# Patient Record
Sex: Female | Born: 1970
Health system: Southern US, Community
[De-identification: ages and names within clinical notes are randomized; demographics above are authoritative.]

## PROBLEM LIST (undated history)

## (undated) DIAGNOSIS — D649 Anemia, unspecified: Secondary | ICD-10-CM

## (undated) DIAGNOSIS — K219 Gastro-esophageal reflux disease without esophagitis: Secondary | ICD-10-CM

## (undated) DIAGNOSIS — J45909 Unspecified asthma, uncomplicated: Secondary | ICD-10-CM

## (undated) DIAGNOSIS — D75839 Thrombocytosis, unspecified: Secondary | ICD-10-CM

## (undated) DIAGNOSIS — I10 Essential (primary) hypertension: Secondary | ICD-10-CM

## (undated) HISTORY — DX: Unspecified asthma, uncomplicated: J45.909

## (undated) HISTORY — PX: NO PAST SURGERIES: SHX2092

## (undated) HISTORY — DX: Anemia, unspecified: D64.9

## (undated) HISTORY — DX: Gastro-esophageal reflux disease without esophagitis: K21.9

---

## 2004-05-31 ENCOUNTER — Emergency Department: Payer: Self-pay | Admitting: Emergency Medicine

## 2004-06-18 ENCOUNTER — Emergency Department: Payer: Self-pay | Admitting: Emergency Medicine

## 2012-09-18 LAB — HM MAMMOGRAPHY: HM MAMMO: NORMAL (ref 0–4)

## 2013-10-08 LAB — HM PAP SMEAR: HM Pap smear: NEGATIVE

## 2014-10-11 LAB — LIPID PANEL
HDL: 97 mg/dL — AB (ref 35–70)
LDL Cholesterol: 77 mg/dL
TRIGLYCERIDES: 103 mg/dL (ref 40–160)

## 2014-10-11 LAB — HEPATIC FUNCTION PANEL
ALT: 13 U/L (ref 7–35)
AST: 18 U/L (ref 13–35)
Alkaline Phosphatase: 38 U/L (ref 25–125)

## 2014-10-11 LAB — BASIC METABOLIC PANEL
CREATININE: 1 mg/dL (ref <=1.1)
GLUCOSE: 84 mg/dL

## 2015-09-25 ENCOUNTER — Encounter: Payer: Self-pay | Admitting: Family Medicine

## 2015-09-25 ENCOUNTER — Ambulatory Visit (INDEPENDENT_AMBULATORY_CARE_PROVIDER_SITE_OTHER): Payer: BLUE CROSS/BLUE SHIELD | Admitting: Family Medicine

## 2015-09-25 VITALS — BP 130/88 | HR 82 | Temp 98.7°F | Resp 16 | Ht 63.5 in | Wt 164.0 lb

## 2015-09-25 DIAGNOSIS — Z01419 Encounter for gynecological examination (general) (routine) without abnormal findings: Secondary | ICD-10-CM | POA: Diagnosis not present

## 2015-09-25 DIAGNOSIS — R6889 Other general symptoms and signs: Secondary | ICD-10-CM | POA: Diagnosis not present

## 2015-09-25 DIAGNOSIS — Z3041 Encounter for surveillance of contraceptive pills: Secondary | ICD-10-CM | POA: Diagnosis not present

## 2015-09-25 MED ORDER — ALYACEN 1/35 1-35 MG-MCG PO TABS: 1.0000 | ORAL_TABLET | Freq: Every day | ORAL | Status: DC

## 2015-09-25 NOTE — Progress Notes (Signed)
Subjective:    Patient ID: Madison Conway, female    DOB: [birth year], 45 y.o.   MRN: [id number]  HPI: Madison Conway is a 45 y.o. female presenting on 09/25/2015 for Annual Exam   HPI  Pt presents for annual exam.  No breast changes. Ocasional BSE. No vaginal bleeding. No discharge. No pain with sex. LMP 1 week ago. Periods are regular. No cramps or clots. However pt is worried about her iron levels- feeling cold and fatigued all the time. Mammogram done 01/2014 at her office- normal. No family hx of breast cancer. Wants to screen in her 78's.  Exercise regularly. 1 sexual partner- monogamous. Current taking OCP's.   Past Medical History  Diagnosis Date  . Asthma    Social History   Social History  . Marital Status: Single    Spouse Name: N/A  . Number of Children: N/A  . Years of Education: N/A   Occupational History  . Not on file.   Social History Main Topics  . Smoking status: Never Smoker   . Smokeless tobacco: Not on file  . Alcohol Use: Yes  . Drug Use: No  . Sexual Activity: Not on file   Other Topics Concern  . Not on file   Social History Narrative  . No narrative on file   Family History  Problem Relation Age of Onset  . Diabetes Maternal Grandmother    No current outpatient prescriptions on file prior to visit.   No current facility-administered medications on file prior to visit.    Review of Systems  Constitutional: Positive for fatigue. Negative for fever and chills.  HENT: Negative.   Respiratory: Negative for cough, chest tightness and wheezing.   Cardiovascular: Negative for chest pain and leg swelling.  Gastrointestinal: Negative for nausea, vomiting, abdominal pain, diarrhea and constipation.  Endocrine: Positive for cold intolerance. Negative for heat intolerance, polydipsia, polyphagia and polyuria.  Genitourinary: Negative for dysuria, vaginal bleeding, vaginal discharge, difficulty urinating, vaginal pain and pelvic pain.    Musculoskeletal: Negative.   Neurological: Negative for dizziness, light-headedness and numbness.  Psychiatric/Behavioral: Negative.    Per HPI unless specifically indicated above     Objective:    BP 130/88 mmHg  Pulse 82  Temp(Src) 98.7 F (37.1 C) (Oral)  Resp 16  Ht 5' 3.5" (1.613 m)  Wt 164 lb (74.39 kg)  BMI 28.59 kg/m2  LMP 09/19/2015  Wt Readings from Last 3 Encounters:  09/25/15 164 lb (74.39 kg)    Depression screen PHQ 2/9 09/25/2015  Decreased Interest 0  Down, Depressed, Hopeless 0  PHQ - 2 Score 0     Physical Exam  Constitutional: She is oriented to person, place, and time. She appears well-developed and well-nourished.  HENT:  Head: Normocephalic and atraumatic.  Neck: Normal range of motion. Neck supple. No thyromegaly present.  Cardiovascular: Normal rate and regular rhythm.  Exam reveals no gallop and no friction rub.   No murmur heard. Pulmonary/Chest: Breath sounds normal. No respiratory distress. She has no wheezes. Right breast exhibits no inverted nipple, no mass, no nipple discharge, no skin change and no tenderness. Left breast exhibits no inverted nipple, no mass, no nipple discharge, no skin change and no tenderness.  Abdominal: Soft. Bowel sounds are normal. She exhibits no distension. There is no tenderness.  Genitourinary: Vagina normal and uterus normal. No breast swelling, tenderness or discharge. There is no rash or tenderness on the right labia. There is no rash, tenderness or lesion  on the left labia. Uterus is not tender. Cervix exhibits no motion tenderness. Right adnexum displays no mass, no tenderness and no fullness. Left adnexum displays no mass, no tenderness and no fullness. No tenderness in the vagina.  Musculoskeletal: Normal range of motion. She exhibits no edema or tenderness.  Lymphadenopathy:    She has no cervical adenopathy.  Neurological: She is alert and oriented to person, place, and time.  Skin: Skin is warm and dry.   Psychiatric: She has a normal mood and affect. Her behavior is normal. Judgment normal.   Results for orders placed or performed in visit on 09/25/15  HM MAMMOGRAPHY  Result Value Ref Range   HM Mammogram Self Reported Normal 0-4 Bi-Rad, Self Reported Normal  HM PAP SMEAR  Result Value Ref Range   HM Pap smear negative       Assessment & Plan:   Problem List Items Addressed This Visit    None    Visit Diagnoses    Well woman exam with routine gynecological exam    -  Primary    Pap due 2018. Health maintenance recommendations reviewed. Encouraged healthy lifestyle. Baseline Mammo WNL. Pt will screen every 2 years.     Relevant Orders    TSH    CBC with Differential/Platelet    Basic Metabolic Panel (BMET)    Cold intolerance        Check TSH and CBC.     Relevant Orders    TSH    CBC with Differential/Platelet    Oral contraceptive pill surveillance        MEC criteria 2- discussed risks vs benefits for OCPs in >40. Recommend LARC. Information given to patient. She will research and self refer to GYN.        Meds ordered this encounter  Medications  . DISCONTD: ALAYCEN 1/35 tablet    Sig: Take 1 tablet by mouth daily.    Refill:  11  . ALAYCEN 1/35 tablet    Sig: Take 1 tablet by mouth daily.    Dispense:  1 Package    Refill:  11    Order Specific Question:  Supervising Provider    Answer:  Arlis Porta [phone]      Follow up plan: Return in about 1 year (around 09/24/2016).

## 2015-09-25 NOTE — Patient Instructions (Addendum)
[facility]:  [facility] [address] [place], [place] [address] PHONE [phone] FAX [phone]  Levonorgestrel intrauterine device (IUD) What is this medicine? LEVONORGESTREL IUD (LEE voe nor jes trel) is a contraceptive (birth control) device. The device is placed inside the uterus by a healthcare professional. It is used to prevent pregnancy and can also be used to treat heavy bleeding that occurs during your period. Depending on the device, it can be used for 3 to 5 years. This medicine may be used for other purposes; ask your health care provider or pharmacist if you have questions. What should I tell my health care provider before I take this medicine? They need to know if you have any of these conditions: -abnormal Pap smear -cancer of the breast, uterus, or cervix -diabetes -endometritis -genital or pelvic infection now or in the past -have more than one sexual partner or your partner has more than one partner -heart disease -history of an ectopic or tubal pregnancy -immune system problems -IUD in place -liver disease or tumor -problems with blood clots or take blood-thinners -use intravenous drugs -uterus of unusual shape -vaginal bleeding that has not been explained -an unusual or allergic reaction to levonorgestrel, other hormones, silicone, or polyethylene, medicines, foods, dyes, or preservatives -pregnant or trying to get pregnant -breast-feeding How should I use this medicine? This device is placed inside the uterus by a health care professional. Talk to your pediatrician regarding the use of this medicine in children. Special care may be needed. Overdosage: If you think you have taken too much of this medicine contact a poison control center or emergency room at once. NOTE: This medicine is only for you. Do not share this medicine with others. What if I miss a dose? This does not apply. What may interact with this  medicine? Do not take this medicine with any of the following medications: -amprenavir -bosentan -fosamprenavir This medicine may also interact with the following medications: -aprepitant -barbiturate medicines for inducing sleep or treating seizures -bexarotene -griseofulvin -medicines to treat seizures like carbamazepine, ethotoin, felbamate, oxcarbazepine, phenytoin, topiramate -modafinil -pioglitazone -rifabutin -rifampin -rifapentine -some medicines to treat HIV infection like atazanavir, indinavir, lopinavir, nelfinavir, tipranavir, ritonavir -St. John's wort -warfarin This list may not describe all possible interactions. Give your health care provider a list of all the medicines, herbs, non-prescription drugs, or dietary supplements you use. Also tell them if you smoke, drink alcohol, or use illegal drugs. Some items may interact with your medicine. What should I watch for while using this medicine? Visit your doctor or health care professional for regular check ups. See your doctor if you or your partner has sexual contact with others, becomes HIV positive, or gets a sexual transmitted disease. This product does not protect you against HIV infection (AIDS) or other sexually transmitted diseases. You can check the placement of the IUD yourself by reaching up to the top of your vagina with clean fingers to feel the threads. Do not pull on the threads. It is a good habit to check placement after each menstrual period. Call your doctor right away if you feel more of the IUD than just the threads or if you cannot feel the threads at all. The IUD may come out by itself. You may become pregnant if the device comes out. If you notice that the IUD has come out use a backup birth control method like condoms and call your health care provider. Using tampons will not change  the position of the IUD and are okay to use during your period. What side effects may I notice from receiving this  medicine? Side effects that you should report to your doctor or health care professional as soon as possible: -allergic reactions like skin rash, itching or hives, swelling of the face, lips, or tongue -fever, flu-like symptoms -genital sores -high blood pressure -no menstrual period for 6 weeks during use -pain, swelling, warmth in the leg -pelvic pain or tenderness -severe or sudden headache -signs of pregnancy -stomach cramping -sudden shortness of breath -trouble with balance, talking, or walking -unusual vaginal bleeding, discharge -yellowing of the eyes or skin Side effects that usually do not require medical attention (report to your doctor or health care professional if they continue or are bothersome): -acne -breast pain -change in sex drive or performance -changes in weight -cramping, dizziness, or faintness while the device is being inserted -headache -irregular menstrual bleeding within first 3 to 6 months of use -nausea This list may not describe all possible side effects. Call your doctor for medical advice about side effects. You may report side effects to FDA at 1-800-FDA-1088. Where should I keep my medicine? This does not apply. NOTE: This sheet is a summary. It may not cover all possible information. If you have questions about this medicine, talk to your doctor, pharmacist, or health care provider.    2016, Elsevier/Gold Standard. (2011-07-11 13:54:04)

## 2015-09-26 DIAGNOSIS — Z01419 Encounter for gynecological examination (general) (routine) without abnormal findings: Secondary | ICD-10-CM | POA: Diagnosis not present

## 2015-09-26 DIAGNOSIS — R6889 Other general symptoms and signs: Secondary | ICD-10-CM | POA: Diagnosis not present

## 2015-09-27 LAB — BASIC METABOLIC PANEL
BUN/Creatinine Ratio: 9 (ref 9–23)
BUN: 8 mg/dL (ref 6–24)
CALCIUM: 9.2 mg/dL (ref 8.7–10.2)
CO2: 24 mmol/L (ref 18–29)
Chloride: 100 mmol/L (ref 96–106)
Creatinine, Ser: 0.87 mg/dL (ref 0.57–1.00)
GFR calc Af Amer: 94 mL/min/{1.73_m2} (ref >59)
GFR, EST NON AFRICAN AMERICAN: 82 mL/min/{1.73_m2} (ref >59)
Glucose: 91 mg/dL (ref 65–99)
POTASSIUM: 4.3 mmol/L (ref 3.5–5.2)
SODIUM: 138 mmol/L (ref 134–144)

## 2015-09-27 LAB — CBC WITH DIFFERENTIAL/PLATELET
BASOS: 1 %
Basophils Absolute: 0.1 10*3/uL (ref 0.0–0.2)
EOS (ABSOLUTE): 0.3 10*3/uL (ref 0.0–0.4)
EOS: 4 %
HEMATOCRIT: 33 % — AB (ref 34.0–46.6)
Hemoglobin: 10.7 g/dL — ABNORMAL LOW (ref 11.1–15.9)
Immature Grans (Abs): 0 10*3/uL (ref 0.0–0.1)
Immature Granulocytes: 0 %
LYMPHS ABS: 2.7 10*3/uL (ref 0.7–3.1)
Lymphs: 38 %
MCH: 25.8 pg — ABNORMAL LOW (ref 26.6–33.0)
MCHC: 32.4 g/dL (ref 31.5–35.7)
MCV: 80 fL (ref 79–97)
MONOS ABS: 0.6 10*3/uL (ref 0.1–0.9)
Monocytes: 8 %
NEUTROS ABS: 3.5 10*3/uL (ref 1.4–7.0)
Neutrophils: 49 %
Platelets: 418 10*3/uL — ABNORMAL HIGH (ref 150–379)
RBC: 4.15 x10E6/uL (ref 3.77–5.28)
RDW: 16 % — AB (ref 12.3–15.4)
WBC: 7.1 10*3/uL (ref 3.4–10.8)

## 2015-09-27 LAB — TSH: TSH: 1.17 u[IU]/mL (ref 0.450–4.500)

## 2015-12-01 ENCOUNTER — Telehealth: Payer: Self-pay | Admitting: Family Medicine

## 2015-12-01 DIAGNOSIS — Z114 Encounter for screening for human immunodeficiency virus [HIV]: Secondary | ICD-10-CM

## 2015-12-01 DIAGNOSIS — D509 Iron deficiency anemia, unspecified: Secondary | ICD-10-CM

## 2015-12-01 NOTE — Telephone Encounter (Signed)
FYI I have placed the orders as future. They just need to be released when she comes for labs. Thanks! AK

## 2015-12-01 NOTE — Telephone Encounter (Signed)
Pt have sent message My Chart requesting HIV added to order

## 2015-12-02 ENCOUNTER — Encounter: Payer: Self-pay | Admitting: Family Medicine

## 2015-12-04 ENCOUNTER — Other Ambulatory Visit: Payer: Self-pay | Admitting: *Deleted

## 2015-12-04 DIAGNOSIS — Z114 Encounter for screening for human immunodeficiency virus [HIV]: Secondary | ICD-10-CM | POA: Diagnosis not present

## 2015-12-04 DIAGNOSIS — D509 Iron deficiency anemia, unspecified: Secondary | ICD-10-CM | POA: Diagnosis not present

## 2015-12-04 DIAGNOSIS — Z1159 Encounter for screening for other viral diseases: Secondary | ICD-10-CM | POA: Diagnosis not present

## 2015-12-05 ENCOUNTER — Other Ambulatory Visit: Payer: BLUE CROSS/BLUE SHIELD

## 2015-12-05 LAB — CBC WITH DIFFERENTIAL/PLATELET
BASOS ABS: 61 {cells}/uL (ref 0–200)
Basophils Relative: 1 %
Eosinophils Absolute: 244 cells/uL (ref 15–500)
Eosinophils Relative: 4 %
HEMATOCRIT: 34.6 % — AB (ref 35.0–45.0)
HEMOGLOBIN: 10.9 g/dL — AB (ref 11.7–15.5)
LYMPHS ABS: 1830 {cells}/uL (ref 850–3900)
Lymphocytes Relative: 30 %
MCH: 25.5 pg — AB (ref 27.0–33.0)
MCHC: 31.5 g/dL — AB (ref 32.0–36.0)
MCV: 81 fL (ref 80.0–100.0)
MONO ABS: 488 {cells}/uL (ref 200–950)
MPV: 8.3 fL (ref 7.5–12.5)
Monocytes Relative: 8 %
NEUTROS PCT: 57 %
Neutro Abs: 3477 cells/uL (ref 1500–7800)
Platelets: 445 10*3/uL — ABNORMAL HIGH (ref 140–400)
RBC: 4.27 MIL/uL (ref 3.80–5.10)
RDW: 16.7 % — ABNORMAL HIGH (ref 11.0–15.0)
WBC: 6.1 10*3/uL (ref 3.8–10.8)

## 2015-12-05 LAB — IRON AND TIBC
%SAT: 20 % (ref 11–50)
IRON: 82 ug/dL (ref 40–190)
TIBC: 405 ug/dL (ref 250–450)
UIBC: 323 ug/dL (ref 125–400)

## 2015-12-05 LAB — FERRITIN: Ferritin: 17 ng/mL (ref 10–232)

## 2015-12-06 LAB — HIV ANTIBODY (ROUTINE TESTING W REFLEX): HIV: NONREACTIVE

## 2015-12-06 LAB — RETICULOCYTES
ABS RETIC: 42100 {cells}/uL (ref 20000–80000)
RBC.: 4.21 MIL/uL (ref 3.80–5.10)
RETIC CT PCT: 1 %

## 2015-12-07 ENCOUNTER — Encounter: Payer: Self-pay | Admitting: Family Medicine

## 2015-12-07 ENCOUNTER — Ambulatory Visit (INDEPENDENT_AMBULATORY_CARE_PROVIDER_SITE_OTHER): Payer: BLUE CROSS/BLUE SHIELD | Admitting: Family Medicine

## 2015-12-07 VITALS — BP 138/86 | HR 77 | Temp 98.6°F | Resp 16 | Ht 63.5 in | Wt 165.6 lb

## 2015-12-07 DIAGNOSIS — D509 Iron deficiency anemia, unspecified: Secondary | ICD-10-CM

## 2015-12-07 DIAGNOSIS — Z23 Encounter for immunization: Secondary | ICD-10-CM | POA: Diagnosis not present

## 2015-12-07 NOTE — Assessment & Plan Note (Signed)
Iron studies are WNL limits. Still mild anemia. Pt feeling better with iron supplementation. Will plan on rechecking labs in 3 mos. If still anemic consider hematology for work-up of anemia possible thalassemia.

## 2015-12-07 NOTE — Progress Notes (Signed)
Subjective:    Patient ID: Madison Conway, female    DOB: 1971/06/05, 45 y.o.   MRN: [id number]  HPI: Madison Conway is a 45 y.o. female presenting on 12/07/2015 for lab result   HPI  Patient presents today for follow up for anemia. Patient has been taking iron since last visit and symptoms have completely resolved, in fact has had more energy and feels like herself. Denies SOB, dizziness, "feeling cold," lack of energy. Reports she is also adding spinach to her diet. Denies heavy bleeding, clots or any other bleeding issues.  Anemia has been a chronic issues in the past.  Reports no issues with taking iron, denies constipation, regular BMs that are soft.  Past Medical History  Diagnosis Date  . Asthma     Current Outpatient Prescriptions on File Prior to Visit  Medication Sig  . ALAYCEN 1/35 tablet Take 1 tablet by mouth daily.   No current facility-administered medications on file prior to visit.    Review of Systems  Constitutional: Negative for fever, chills, activity change, appetite change, fatigue and unexpected weight change.  HENT: Negative.   Eyes: Negative for pain and redness.  Respiratory: Negative for cough, chest tightness and wheezing.   Cardiovascular: Negative for chest pain and leg swelling.  Gastrointestinal: Negative for nausea, vomiting, abdominal pain, diarrhea and constipation.  Endocrine: Negative.  Negative for cold intolerance, heat intolerance, polydipsia, polyphagia and polyuria.  Genitourinary: Negative for dysuria and difficulty urinating.  Musculoskeletal: Negative for myalgias, back pain, arthralgias, neck pain and neck stiffness.  Skin: Negative for color change.  Neurological: Negative for dizziness, light-headedness and numbness.  Hematological: Negative for adenopathy. Does not bruise/bleed easily.  Psychiatric/Behavioral: Negative.    Per HPI unless specifically indicated above     Objective:    BP 138/86 mmHg  Pulse 77   Temp(Src) 98.6 F (37 C) (Oral)  Resp 16  Ht 5' 3.5" (1.613 m)  Wt 165 lb 9.6 oz (75.116 kg)  BMI 28.87 kg/m2  LMP 11/19/2015  Wt Readings from Last 3 Encounters:  12/07/15 165 lb 9.6 oz (75.116 kg)  09/25/15 164 lb (74.39 kg)    Physical Exam  Constitutional: She is oriented to person, place, and time. She appears well-developed and well-nourished.  HENT:  Head: Normocephalic and atraumatic.  Neck: Neck supple.  Cardiovascular: Normal rate, regular rhythm and normal heart sounds.  Exam reveals no gallop and no friction rub.   No murmur heard. Pulmonary/Chest: Effort normal and breath sounds normal. She has no wheezes. She exhibits no tenderness.  Abdominal: Soft. Normal appearance and bowel sounds are normal. She exhibits no distension and no mass. There is no tenderness. There is no rebound and no guarding.  Musculoskeletal: Normal range of motion. She exhibits no edema or tenderness.  Lymphadenopathy:    She has no cervical adenopathy.  Neurological: She is alert and oriented to person, place, and time.  Skin: Skin is warm and dry.  Nursing note and vitals reviewed.  Results for orders placed or performed in visit on 12/04/15  CBC with Differential/Platelet  Result Value Ref Range   WBC 6.1 3.8 - 10.8 K/uL   RBC 4.27 3.80 - 5.10 MIL/uL   Hemoglobin 10.9 (L) 11.7 - 15.5 g/dL   HCT 34.6 (L) 35.0 - 45.0 %   MCV 81.0 80.0 - 100.0 fL   MCH 25.5 (L) 27.0 - 33.0 pg   MCHC 31.5 (L) 32.0 - 36.0 g/dL   RDW 16.7 (H) 11.0 -  15.0 %   Platelets 445 (H) 140 - 400 K/uL   MPV 8.3 7.5 - 12.5 fL   Neutro Abs 3477 1500 - 7800 cells/uL   Lymphs Abs 1830 850 - 3900 cells/uL   Monocytes Absolute 488 200 - 950 cells/uL   Eosinophils Absolute 244 15 - 500 cells/uL   Basophils Absolute 61 0 - 200 cells/uL   Neutrophils Relative % 57 %   Lymphocytes Relative 30 %   Monocytes Relative 8 %   Eosinophils Relative 4 %   Basophils Relative 1 %   Smear Review Criteria for review not met     Ferritin  Result Value Ref Range   Ferritin 17 10 - 232 ng/mL  Iron Binding Cap (TIBC)  Result Value Ref Range   Iron 82 40 - 190 ug/dL   UIBC 323 125 - 400 ug/dL   TIBC 405 250 - 450 ug/dL   %SAT 20 11 - 50 %  Retic  Result Value Ref Range   Retic Ct Pct 1.0 %   RBC. 4.21 3.80 - 5.10 MIL/uL   ABS Retic 42100 20000 - 80000 cells/uL  HIV antibody (with reflex)  Result Value Ref Range   HIV 1&2 Ab, 4th Generation NONREACTIVE NONREACTIVE      Assessment & Plan:   Problem List Items Addressed This Visit      Other   Iron deficiency anemia - Primary    Iron studies are WNL limits. Still mild anemia. Pt feeling better with iron supplementation. Will plan on rechecking labs in 3 mos. If still anemic consider hematology for work-up of anemia possible thalassemia.        Other Visit Diagnoses    Need for Tdap vaccination        Relevant Orders    Tdap vaccine greater than or equal to 7yo IM (Completed)       No orders of the defined types were placed in this encounter.      Follow up plan: Return in about 3 months (around 03/08/2016) for labs- anemia recheck. Marland Kitchen

## 2015-12-07 NOTE — Patient Instructions (Signed)
Iron Deficiency Anemia, Adult Anemia is a condition in which there are less red blood cells or hemoglobin in the blood than normal. Hemoglobin is the part of red blood cells that carries oxygen. Iron deficiency anemia is anemia caused by too little iron. It is the most common type of anemia. It may leave you tired and short of breath. CAUSES   Lack of iron in the diet.  Poor absorption of iron, as seen with intestinal disorders.  Intestinal bleeding.  Heavy periods. SIGNS AND SYMPTOMS  Mild anemia may not be noticeable. Symptoms may include:  Fatigue.  Headache.  Pale skin.  Weakness.  Tiredness.  Shortness of breath.  Dizziness.  Cold hands and feet.  Fast or irregular heartbeat. DIAGNOSIS  Diagnosis requires a thorough evaluation and physical exam by your health care provider. Blood tests are generally used to confirm iron deficiency anemia. Additional tests may be done to find the underlying cause of your anemia. These may include:  Testing for blood in the stool (fecal occult blood test).  A procedure to see inside the colon and rectum (colonoscopy).  A procedure to see inside the esophagus and stomach (endoscopy). TREATMENT  Iron deficiency anemia is treated by correcting the cause of the deficiency. Treatment may involve:  Adding iron-rich foods to your diet.  Taking iron supplements. Pregnant or breastfeeding women need to take extra iron because their normal diet usually does not provide the required amount.  Taking vitamins. Vitamin C improves the absorption of iron. Your health care provider may recommend that you take your iron tablets with a glass of orange juice or vitamin C supplement.  Medicines to make heavy menstrual flow lighter.  Surgery. HOME CARE INSTRUCTIONS   Take iron as directed by your health care provider.  If you cannot tolerate taking iron supplements by mouth, talk to your health care provider about taking them through a vein  (intravenously) or an injection into a muscle.  For the best iron absorption, iron supplements should be taken on an empty stomach. If you cannot tolerate them on an empty stomach, you may need to take them with food.  Do not drink milk or take antacids at the same time as your iron supplements. Milk and antacids may interfere with the absorption of iron.  Iron supplements can cause constipation. Make sure to include fiber in your diet to prevent constipation. A stool softener may also be recommended.  Take vitamins as directed by your health care provider.  Eat a diet rich in iron. Foods high in iron include liver, lean beef, whole-grain bread, eggs, dried fruit, and dark green leafy vegetables. SEEK IMMEDIATE MEDICAL CARE IF:   You faint. If this happens, do not drive. Call your local emergency services (911 in [place] if no other help is available.  You have chest pain.  You feel nauseous or vomit.  You have severe or increased shortness of breath with activity.  You feel weak.  You have a rapid heartbeat.  You have unexplained sweating.  You become light-headed when getting up from a chair or bed. MAKE SURE YOU:   Understand these instructions.  Will watch your condition.  Will get help right away if you are not doing well or get worse.   This information is not intended to replace advice given to you by your health care provider. Make sure you discuss any questions you have with your health care provider.   Document Released: 06/07/2000 Document Revised: 07/01/2014 Document Reviewed: 02/15/2013 Elsevier   Interactive Patient Education 2016 Elsevier Inc.  

## 2016-02-28 ENCOUNTER — Encounter: Payer: Self-pay | Admitting: Family Medicine

## 2016-02-28 LAB — LIPID PANEL
CHOLESTEROL: 195
HDL: 101
LDL: 76
TRIGLYCERIDES: 86

## 2016-03-07 ENCOUNTER — Other Ambulatory Visit: Payer: BLUE CROSS/BLUE SHIELD

## 2016-03-07 ENCOUNTER — Telehealth: Payer: Self-pay | Admitting: Family Medicine

## 2016-03-07 ENCOUNTER — Other Ambulatory Visit: Payer: Self-pay | Admitting: Family Medicine

## 2016-03-07 DIAGNOSIS — D509 Iron deficiency anemia, unspecified: Secondary | ICD-10-CM

## 2016-03-07 LAB — FERRITIN: Ferritin: 28 ng/mL (ref 10–232)

## 2016-03-07 NOTE — Telephone Encounter (Signed)
Pt needs lab order has appointment today at 4:00 pm

## 2016-03-07 NOTE — Addendum Note (Signed)
Addended byVincenza Hews, Ayelet Gruenewald L on: 03/07/2016 12:35 PM   Modules accepted: Orders

## 2016-03-08 LAB — CBC WITH DIFFERENTIAL/PLATELET
BASOS ABS: 0 {cells}/uL (ref 0–200)
Basophils Relative: 0 %
EOS ABS: 355 {cells}/uL (ref 15–500)
Eosinophils Relative: 5 %
HCT: 34.7 % — ABNORMAL LOW (ref 35.0–45.0)
Hemoglobin: 11 g/dL — ABNORMAL LOW (ref 11.7–15.5)
LYMPHS PCT: 32 %
Lymphs Abs: 2272 cells/uL (ref 850–3900)
MCH: 26.8 pg — AB (ref 27.0–33.0)
MCHC: 31.7 g/dL — AB (ref 32.0–36.0)
MCV: 84.4 fL (ref 80.0–100.0)
MONOS PCT: 9 %
MPV: 8.2 fL (ref 7.5–12.5)
Monocytes Absolute: 639 cells/uL (ref 200–950)
Neutro Abs: 3834 cells/uL (ref 1500–7800)
Neutrophils Relative %: 54 %
PLATELETS: 406 10*3/uL — AB (ref 140–400)
RBC: 4.11 MIL/uL (ref 3.80–5.10)
RDW: 15.2 % — AB (ref 11.0–15.0)
WBC: 7.1 10*3/uL (ref 3.8–10.8)

## 2016-03-08 LAB — IRON AND TIBC
%SAT: 3 % — AB (ref 11–50)
Iron: 13 ug/dL — ABNORMAL LOW (ref 40–190)
TIBC: 385 ug/dL (ref 250–450)
UIBC: 372 ug/dL (ref 125–400)

## 2016-03-08 LAB — RETICULOCYTES
ABS Retic: 36990 cells/uL (ref 20000–80000)
RBC.: 4.11 MIL/uL (ref 3.80–5.10)
Retic Ct Pct: 0.9 %

## 2016-07-09 ENCOUNTER — Ambulatory Visit (INDEPENDENT_AMBULATORY_CARE_PROVIDER_SITE_OTHER): Payer: BLUE CROSS/BLUE SHIELD | Admitting: Family Medicine

## 2016-07-09 ENCOUNTER — Ambulatory Visit
Admission: RE | Admit: 2016-07-09 | Discharge: 2016-07-09 | Disposition: A | Discharge status: Home / Self Care | Payer: BLUE CROSS/BLUE SHIELD | Source: Ambulatory Visit | Attending: Family Medicine | Admitting: Family Medicine

## 2016-07-09 ENCOUNTER — Observation Stay
Admission: EM | Admit: 2016-07-09 | Discharge: 2016-07-10 | Disposition: A | Discharge status: Home / Self Care | Payer: BLUE CROSS/BLUE SHIELD | Attending: Internal Medicine | Admitting: Internal Medicine

## 2016-07-09 ENCOUNTER — Encounter: Payer: Self-pay | Admitting: Emergency Medicine

## 2016-07-09 ENCOUNTER — Encounter: Payer: Self-pay | Admitting: Family Medicine

## 2016-07-09 VITALS — BP 138/88 | HR 92 | Temp 98.7°F | Resp 16 | Ht 63.5 in | Wt 167.6 lb

## 2016-07-09 DIAGNOSIS — R109 Unspecified abdominal pain: Secondary | ICD-10-CM

## 2016-07-09 DIAGNOSIS — R110 Nausea: Secondary | ICD-10-CM | POA: Diagnosis not present

## 2016-07-09 DIAGNOSIS — R1013 Epigastric pain: Secondary | ICD-10-CM | POA: Diagnosis not present

## 2016-07-09 DIAGNOSIS — R1011 Right upper quadrant pain: Secondary | ICD-10-CM | POA: Diagnosis not present

## 2016-07-09 DIAGNOSIS — Z793 Long term (current) use of hormonal contraceptives: Secondary | ICD-10-CM | POA: Diagnosis not present

## 2016-07-09 DIAGNOSIS — K8050 Calculus of bile duct without cholangitis or cholecystitis without obstruction: Secondary | ICD-10-CM

## 2016-07-09 DIAGNOSIS — K819 Cholecystitis, unspecified: Secondary | ICD-10-CM | POA: Diagnosis not present

## 2016-07-09 LAB — COMPREHENSIVE METABOLIC PANEL
ALBUMIN: 4 g/dL (ref 3.5–5.0)
ALK PHOS: 63 U/L (ref 38–126)
ALT: 59 U/L — ABNORMAL HIGH (ref 14–54)
ANION GAP: 7 (ref 5–15)
AST: 38 U/L (ref 15–41)
BILIRUBIN TOTAL: 1.3 mg/dL — AB (ref 0.3–1.2)
BUN: 6 mg/dL (ref 6–20)
CALCIUM: 9 mg/dL (ref 8.9–10.3)
CO2: 25 mmol/L (ref 22–32)
Chloride: 103 mmol/L (ref 101–111)
Creatinine, Ser: 0.96 mg/dL (ref 0.44–1.00)
GFR calc Af Amer: >60 mL/min (ref >60)
GLUCOSE: 93 mg/dL (ref 65–99)
Potassium: 3.9 mmol/L (ref 3.5–5.1)
Sodium: 135 mmol/L (ref 135–145)
Total Protein: 8.1 g/dL (ref 6.5–8.1)

## 2016-07-09 LAB — CBC
HCT: 35.2 % (ref 35.0–47.0)
Hemoglobin: 11.7 g/dL — ABNORMAL LOW (ref 12.0–16.0)
MCH: 27.3 pg (ref 26.0–34.0)
MCHC: 33.2 g/dL (ref 32.0–36.0)
MCV: 82.4 fL (ref 80.0–100.0)
Platelets: 433 10*3/uL (ref 150–440)
RBC: 4.27 MIL/uL (ref 3.80–5.20)
RDW: 15.3 % — AB (ref 11.5–14.5)
WBC: 9.1 10*3/uL (ref 3.6–11.0)

## 2016-07-09 LAB — URINALYSIS, COMPLETE (UACMP) WITH MICROSCOPIC
Bacteria, UA: NONE SEEN
Bilirubin Urine: NEGATIVE
GLUCOSE, UA: NEGATIVE mg/dL
HGB URINE DIPSTICK: NEGATIVE
Ketones, ur: 5 mg/dL — AB
NITRITE: NEGATIVE
PH: 6 (ref 5.0–8.0)
Protein, ur: NEGATIVE mg/dL
SPECIFIC GRAVITY, URINE: 1.006 (ref 1.005–1.030)
WBC, UA: NONE SEEN WBC/hpf (ref 0–5)

## 2016-07-09 LAB — LIPASE, BLOOD: Lipase: 25 U/L (ref 11–51)

## 2016-07-09 MED ORDER — ENOXAPARIN SODIUM 40 MG/0.4ML ~~LOC~~ SOLN
40.0000 mg | SUBCUTANEOUS | Status: DC
  Filled 2016-07-09 (×2): qty 0.4

## 2016-07-09 MED ORDER — GI COCKTAIL ~~LOC~~
30.0000 mL | Freq: Once | ORAL | Status: AC
  Administered 2016-07-09: 30 mL via ORAL
  Filled 2016-07-09: qty 30

## 2016-07-09 MED ORDER — SODIUM CHLORIDE 0.9 % IV SOLN
3.0000 g | Freq: Four times a day (QID) | INTRAVENOUS | Status: DC
  Administered 2016-07-10 (×2): 3 g via INTRAVENOUS
  Filled 2016-07-09 (×7): qty 3

## 2016-07-09 MED ORDER — MORPHINE SULFATE (PF) 2 MG/ML IV SOLN: 1.0000 mg | INTRAVENOUS | Status: DC | PRN

## 2016-07-09 MED ORDER — HYDROCODONE-ACETAMINOPHEN 5-325 MG PO TABS
1.0000 | ORAL_TABLET | ORAL | Status: DC | PRN
  Administered 2016-07-10: 1 via ORAL
  Filled 2016-07-09: qty 1

## 2016-07-09 MED ORDER — ONDANSETRON HCL 4 MG/2ML IJ SOLN: 4.0000 mg | Freq: Four times a day (QID) | INTRAMUSCULAR | Status: DC | PRN

## 2016-07-09 MED ORDER — ACETAMINOPHEN 650 MG RE SUPP
650.0000 mg | Freq: Four times a day (QID) | RECTAL | Status: DC | PRN
  Filled 2016-07-09: qty 1

## 2016-07-09 MED ORDER — SODIUM CHLORIDE 0.9 % IV SOLN
INTRAVENOUS | Status: DC
  Administered 2016-07-10: via INTRAVENOUS

## 2016-07-09 MED ORDER — ACETAMINOPHEN 325 MG PO TABS: 650.0000 mg | ORAL_TABLET | Freq: Four times a day (QID) | ORAL | Status: DC | PRN

## 2016-07-09 MED ORDER — SUCRALFATE 1 G PO TABS: 1.0000 g | ORAL_TABLET | Freq: Three times a day (TID) | ORAL | 0 refills | Status: DC

## 2016-07-09 MED ORDER — ONDANSETRON HCL 4 MG PO TABS
4.0000 mg | ORAL_TABLET | Freq: Four times a day (QID) | ORAL | Status: DC | PRN
  Filled 2016-07-09: qty 1

## 2016-07-09 MED ORDER — ONDANSETRON 4 MG PO TBDP: 4.0000 mg | ORAL_TABLET | Freq: Three times a day (TID) | ORAL | 0 refills | Status: DC | PRN

## 2016-07-09 MED ORDER — SODIUM CHLORIDE 0.9 % IV BOLUS (SEPSIS)
1000.0000 mL | Freq: Once | INTRAVENOUS | Status: AC
  Administered 2016-07-09: 1000 mL via INTRAVENOUS

## 2016-07-09 NOTE — Patient Instructions (Signed)
Thank you for coming in to clinic today.  1. I am concerned about possible gallbladder problem, most common is gallstones causing biliary colic, this recurrent abdominal pain with eating and persistent pain can be symptoms of that - Sent medicines in to try as needed, Zofran ODT dissolving tab 4mg  PRN nausea, take 1 every 8 hours as needed - May try Carafate tablet with food up to 4 times a day for upset stomach, may help settle stomach pain as well, especially if taking regular Ibuprofen or Aleve - Recommend to start taking Tylenol Extra Strength 500mg  tabs - take 1 to 2 tabs per dose (max 1000mg ) every 6-8 hours for pain, max 24 hour daily dose is 6 tablets or 3000mg . In the future you can repeat the same everyday Tylenol course for 1-2 weeks at a time.  - This is safer on stomach compared to Aleve  Try to stay well hydrated after your ultrasound.  Abdominal Ultrasound Scheduled for [facility] at 4:00pm (since you have to have had no food or drink in about 8-9 hours)  Go to [facility] today to get labs drawn. We will call you likely tomorrow with results to discuss further  If concern on [place] or labs that requires more urgent attention, we may advise you to seek care at Emergency Dept  Otherwise if gallstones, we may work on urgent referral to Surgery office. Stay tuned.  If worsening pain and severe symptoms, you may go to hospital ED sooner if needed, especially if fevers, no appetite at all and nausea, vomiting, lower abdominal or generalized abdominal pain, worst with movements  Please schedule a follow-up appointment with Dr. Parks Ranger within 1 week as needed for abdominal pain, if not improved  If you have any other questions or concerns, please feel free to call the clinic or send a message through [place]. You may also schedule an earlier appointment if necessary.  Nobie Putnam, DO [place]

## 2016-07-09 NOTE — Progress Notes (Signed)
Subjective:    Patient ID: Madison Conway, female    DOB: 1971/03/08, 46 y.o.   MRN: [id number]  Madison Conway is a 46 y.o. female presenting on 07/09/2016 for Nausea (pain Right side underneath of ribs getting nauseous onset 4 days as per pt could be food poisoning not sure)  Patient presents for a same day appointment.  HPI  RUQ ABDOMINAL PAIN: - Reports symptoms started about 3 days ago with acute onset epigastric / RUQ abdominal pain within 1 hour following meal, worsening over next 2 hours and even worse with trial of OTC Gas-x, describes that initial pain was severe burning pain 10/10, then she self induced vomiting and had much improvement pain, she ate the same food from day before (and initially thought maybe food poisoning with some old ranch dressing, did eat fried food), still had lingering mild-mod aching pain but no longer severe. Next day was doing better but only had residual mild aching pain, ate small amount of scrambled eggs and then apple, then 1-2 hour later acute onset worsening pain again same pain only up to 5-6/10 not as severe, again after vomiting felt much better, still aching pain. Yesterday went to work without eating much only crackers and water, constant 3-4/10 aching pain. - Today now with active milder 1-2/10 aching pain, but will be worse with activity or walking, or pressure if laying on abdomen or on R side - Continues tolerate liquids very well without worsening pain or nausea - Tried Aleve without significant relief - No other sick contacts, no one with similar symptoms. No one else ate same food. - History of pancreatitis in age 7s possibly due to alcohol. But currently no alcohol intake recently - No prior history of abdominal surgery in past, has gallbladder and appendix. No known history of gallstones. - No significant history of heartburn / GERD, never used TUMs, zantac, PPI - Admits now improved loss of appetite, but still scared to eat due to  pain - Last BM today, regular soft BM - Admits did have episode of feeling warm and mild sweating with acute severe pain initially and vomiting - Denies fevers/chills, sweats, diarrhea, constipation, chest pain, dyspnea, jaundice or skin changes, confusion  Social History  Substance Use Topics  . Smoking status: Never Smoker  . Smokeless tobacco: Never Used  . Alcohol use Yes     Comment: occ.    Review of Systems Per HPI unless specifically indicated above     Objective:    BP 138/88 (BP Location: Left Arm, Cuff Size: Normal)   Pulse 92   Temp 98.7 F (37.1 C) (Oral)   Resp 16   Ht 5' 3.5" (1.613 m)   Wt 167 lb 9.6 oz (76 kg)   LMP 06/25/2016   BMI 29.22 kg/m   Wt Readings from Last 3 Encounters:  07/09/16 167 lb (75.8 kg)  07/09/16 167 lb 9.6 oz (76 kg)  12/07/15 165 lb 9.6 oz (75.1 kg)    Physical Exam  Constitutional: She is oriented to person, place, and time. She appears well-developed and well-nourished. No distress.  Well-appearing, mostly comfortable if seated up and resting otherwise some mild discomfort with position change, cooperative  HENT:  Head: Normocephalic and atraumatic.  Mouth/Throat: Oropharynx is clear and moist.  Eyes:  No significant scleral icterus on exam. Conjunctiva have scattered brown hazy pigmentation chronically by report with some birthmarks.  Neck: Normal range of motion. Neck supple.  Cardiovascular: Normal rate, regular rhythm,  normal heart sounds and intact distal pulses.   No murmur heard. Pulmonary/Chest: Effort normal and breath sounds normal. No respiratory distress. She has no wheezes. She has no rales. She exhibits no tenderness.  Abdominal: Soft. Bowel sounds are normal. She exhibits no distension and no mass. There is tenderness (RUQ and epigastric localized). There is no rebound and no guarding.  Positive Murphy's sign on exam. Negative McBurney's RLQ pain.  Musculoskeletal: She exhibits no edema.  Lymphadenopathy:     She has no cervical adenopathy.  Neurological: She is alert and oriented to person, place, and time.  Skin: Skin is warm and dry. No rash noted. She is not diaphoretic. No erythema.  Psychiatric: Her behavior is normal.  Nursing note and vitals reviewed.  I have personally reviewed the following lab results from 03/07/16.  Results for orders placed or performed in visit on 03/07/16  CBC with Differential  Result Value Ref Range   WBC 7.1 3.8 - 10.8 K/uL   RBC 4.11 3.80 - 5.10 MIL/uL   Hemoglobin 11.0 (L) 11.7 - 15.5 g/dL   HCT 34.7 (L) 35.0 - 45.0 %   MCV 84.4 80.0 - 100.0 fL   MCH 26.8 (L) 27.0 - 33.0 pg   MCHC 31.7 (L) 32.0 - 36.0 g/dL   RDW 15.2 (H) 11.0 - 15.0 %   Platelets 406 (H) 140 - 400 K/uL   MPV 8.2 7.5 - 12.5 fL   Neutro Abs 3,834 1,500 - 7,800 cells/uL   Lymphs Abs 2,272 850 - 3,900 cells/uL   Monocytes Absolute 639 200 - 950 cells/uL   Eosinophils Absolute 355 15 - 500 cells/uL   Basophils Absolute 0 0 - 200 cells/uL   Neutrophils Relative % 54 %   Lymphocytes Relative 32 %   Monocytes Relative 9 %   Eosinophils Relative 5 %   Basophils Relative 0 %   Smear Review Criteria for review not met   Retic  Result Value Ref Range   Retic Ct Pct 0.9 %   RBC. 4.11 3.80 - 5.10 MIL/uL   ABS Retic 36,990 20,000 - 80,000 cells/uL  Ferritin  Result Value Ref Range   Ferritin 28 10 - 232 ng/mL  Iron Binding Cap (TIBC)  Result Value Ref Range   Iron 13 (L) 40 - 190 ug/dL   UIBC 372 125 - 400 ug/dL   TIBC 385 250 - 450 ug/dL   %SAT 3 (L) 11 - 50 %      Assessment & Plan:   Problem List Items Addressed This Visit    RUQ abdominal pain - Primary    Clinically more suggestive of biliary colic with RUQ abdominal pain worse with fatty foods or now any significant food intake, with nausea, did have some relief with vomiting (self induced), some possible sweats without consistent measured fever. Concern for cholelithiasis vs cholecystitis today. - Differential less likely  appendicitis given location of pain but can be considered, less likely viral or gastroenteritis without significant nausea vomiting/diarrhea as primary symptoms no sick contacts. Less likely pancreatitis given no clear trigger but possible. - Hemodynamically stable, afebrile, mostly comfortable if limited activity, concern with poor PO, however appears to be mostly hydrated still tolerating liquids - No acute abdomen on exam today, uncomfortable localized RUQ but no evidence of peritoneal signs or other concern  Plan: 1. Discussion on possible causes of symptoms, she has no prior history of cholelithiasis or cholecystitis, however she is familiar with this disease with family member 2.  Ordered STAT Abdominal RUQ [place], scheduled for today at [facility] radiology, scheduled for 1600 today, f/u results 3. Ordered outpatient labs CMET, CBC, Lipase to be done at [facility], since no lab draw here in office today. If [place] is unremarkable, then will wait on labs for further triage, as patient is currently stable and decision to hold off on stat labs 4. Improve hydration 5. Rx given for Carafate for upset stomach with eating may help, but unlikely to resolve symptoms 6. Also rx sent for Zofran ODT if worsening nausea develops, may hold for now 7. Continue Tylenol regularly, cautious with NSAIDs 8. Follow-up within 1 week as needed based on [place] and lab results, if find cholelithiasis, will work on arranging General Surgery follow-up to discuss further, may need surgery in future if unresolved or recurrent problem 9. Strict return criteria when to go to hospital ED if worsening  UPDATE 07/09/16 1645 with phone call from [facility] radiology with results on STAT RUQ [place], see actual report for full details, but with identified gallbladder wall thickening and small amt pericholecystic fluid, no gallstones detected, concern for acute cholecystitis, would be likely acalculous cholecystitis. - Spoke with patient who remained at [facility]  radiology. Informed her of results, and expressed my concern that given this concern for infection, next step is to go to the ED at [facility] and be seen there for further management, likely will need IV antibiotics / IVF rehydration and more immediate labs, may need additional imaging. Regarding gallbladder surgery, advised her that first step is to treat likely infection, then she will probably have to discuss next steps with General Surgery inpatient vs outpatient to determine if need cholecystectomy. Patient understood, will go to ED.      Relevant Medications   sucralfate (CARAFATE) 1 g tablet   Other Relevant Orders   US Abdomen Limited RUQ (Completed)   Lipase   Comprehensive metabolic panel   CBC with Differential/Platelet    Other Visit Diagnoses    Biliary colic       Relevant Medications   sucralfate (CARAFATE) 1 g tablet   Other Relevant Orders   US Abdomen Limited RUQ (Completed)   Lipase   Comprehensive metabolic panel   Nausea       Relevant Medications   ondansetron (ZOFRAN ODT) 4 MG disintegrating tablet      Meds ordered this encounter  Medications  . ondansetron (ZOFRAN ODT) 4 MG disintegrating tablet    Sig: Take 1 tablet (4 mg total) by mouth every 8 (eight) hours as needed for nausea or vomiting.    Dispense:  20 tablet    Refill:  0  . sucralfate (CARAFATE) 1 g tablet    Sig: Take 1 tablet (1 g total) by mouth 4 (four) times daily -  with meals and at bedtime.    Dispense:  20 tablet    Refill:  0      Follow up plan: Return in about 1 week (around 07/16/2016), or if symptoms worsen or fail to improve, for RUQ abdominal pain.  Nobie Putnam, [facility] 07/09/2016, 6:09 PM

## 2016-07-09 NOTE — Assessment & Plan Note (Signed)
Clinically more suggestive of biliary colic with RUQ abdominal pain worse with fatty foods or now any significant food intake, with nausea, did have some relief with vomiting (self induced), some possible sweats without consistent measured fever. Concern for cholelithiasis vs cholecystitis today. - Differential less likely appendicitis given location of pain but can be considered, less likely viral or gastroenteritis without significant nausea vomiting/diarrhea as primary symptoms no sick contacts. Less likely pancreatitis given no clear trigger but possible. - Hemodynamically stable, afebrile, mostly comfortable if limited activity, concern with poor PO, however appears to be mostly hydrated still tolerating liquids - No acute abdomen on exam today, uncomfortable localized RUQ but no evidence of peritoneal signs or other concern  Plan: 1. Discussion on possible causes of symptoms, she has no prior history of cholelithiasis or cholecystitis, however she is familiar with this disease with family member 2. Ordered STAT Abdominal RUQ [place], scheduled for today at [facility] radiology, scheduled for 1600 today, f/u results 3. Ordered outpatient labs CMET, CBC, Lipase to be done at [facility], since no lab draw here in office today. If [place] is unremarkable, then will wait on labs for further triage, as patient is currently stable and decision to hold off on stat labs 4. Improve hydration 5. Rx given for Carafate for upset stomach with eating may help, but unlikely to resolve symptoms 6. Also rx sent for Zofran ODT if worsening nausea develops, may hold for now 7. Continue Tylenol regularly, cautious with NSAIDs 8. Follow-up within 1 week as needed based on [place] and lab results, if find cholelithiasis, will work on arranging General Surgery follow-up to discuss further, may need surgery in future if unresolved or recurrent problem 9. Strict return criteria when to go to hospital ED if worsening  UPDATE 07/09/16 1645 with  phone call from [facility] radiology with results on STAT RUQ [place], see actual report for full details, but with identified gallbladder wall thickening and small amt pericholecystic fluid, no gallstones detected, concern for acute cholecystitis, would be likely acalculous cholecystitis. - Spoke with patient who remained at [facility] radiology. Informed her of results, and expressed my concern that given this concern for infection, next step is to go to the ED at [facility] and be seen there for further management, likely will need IV antibiotics / IVF rehydration and more immediate labs, may need additional imaging. Regarding gallbladder surgery, advised her that first step is to treat likely infection, then she will probably have to discuss next steps with General Surgery inpatient vs outpatient to determine if need cholecystectomy. Patient understood, will go to ED.

## 2016-07-09 NOTE — ED Triage Notes (Signed)
Pt to ED with abd pain since Saturday.  Pt had ultrasound done today and results called to her PCP who told her to be seen and treated with fluids and antibiotics.  Results showed gallbladder wall thickening with fluid around gallbladder, states no gall stones found.

## 2016-07-09 NOTE — H&P (Signed)
[place] [facility] [place] NAME: Madison Conway    MR#:  [id number]  DATE OF BIRTH:  June 12, 1971  DATE OF ADMISSION:  07/09/2016  PRIMARY CARE PHYSICIAN: Nobie Putnam, DO   REQUESTING/REFERRING PHYSICIAN: Eden Lathe Md  CHIEF COMPLAINT:   Chief Complaint  Patient presents with  . Abdominal Pain    HISTORY OF PRESENT ILLNESS: Madison Conway  is a 46 y.o. female with a known history of  Asthma who is presenting with right upper quadrant abdominal pain. Patient's been having these symptoms since Saturday. She voiced the pain to be sharp in nature for her to 5 out of 10. She also has had fevers and chills. Patient's symptoms have been intermittent up and down. She was seen in the emergency room and ultrasound showed possible cholecystitis. Surgery felt that she needs a HIDA scan. If the HIDA scan is positive Bill take her to the operating room.    PAST MEDICAL HISTORY:   Past Medical History:  Diagnosis Date  . Asthma    childhood.    PAST SURGICAL HISTORY: None  SOCIAL HISTORY:  Social History  Substance Use Topics  . Smoking status: Never Smoker  . Smokeless tobacco: Never Used  . Alcohol use Yes     Comment: occ.    FAMILY HISTORY:  Family History  Problem Relation Age of Onset  . Diabetes Maternal Grandmother     DRUG ALLERGIES: No Known Allergies  REVIEW OF SYSTEMS:   CONSTITUTIONAL: No fever, fatigue or weakness.  EYES: No blurred or double vision.  EARS, NOSE, AND THROAT: No tinnitus or ear pain.  RESPIRATORY: No cough, shortness of breath, wheezing or hemoptysis.  CARDIOVASCULAR: No chest pain, orthopnea, edema.  GASTROINTESTINAL: Positive nausea, no vomiting, diarrhea or right upper abdominal pain.  GENITOURINARY: No dysuria, hematuria.  ENDOCRINE: No polyuria, nocturia,  HEMATOLOGY: No anemia, easy bruising or bleeding SKIN: No rash or lesion. MUSCULOSKELETAL: No joint pain or arthritis.   NEUROLOGIC:  No tingling, numbness, weakness.  PSYCHIATRY: No anxiety or depression.   MEDICATIONS AT HOME:  Prior to Admission medications   Medication Sig Start Date End Date Taking? Authorizing Provider  ALAYCEN 1/35 tablet Take 1 tablet by mouth daily. 09/25/15   Amy Lauren Krebs, NP  ondansetron (ZOFRAN ODT) 4 MG disintegrating tablet Take 1 tablet (4 mg total) by mouth every 8 (eight) hours as needed for nausea or vomiting. 07/09/16   Olin Hauser, DO  sucralfate (CARAFATE) 1 g tablet Take 1 tablet (1 g total) by mouth 4 (four) times daily -  with meals and at bedtime. 07/09/16   Olin Hauser, DO      PHYSICAL EXAMINATION:   VITAL SIGNS: Blood pressure (!) 161/99, pulse 80, temperature 98.3 F (36.8 C), temperature source Oral, resp. rate 18, height 5\' 4"  (1.626 m), weight 167 lb (75.8 kg), last menstrual period 06/25/2016, SpO2 100 %.  GENERAL:  46 y.o.-year-old patient lying in the bed with no acute distress.  EYES: Pupils equal, round, reactive to light and accommodation. No scleral icterus. Extraocular muscles intact.  HEENT: Head atraumatic, normocephalic. Oropharynx and nasopharynx clear.  NECK:  Supple, no jugular venous distention. No thyroid enlargement, no tenderness.  LUNGS: Normal breath sounds bilaterally, no wheezing, rales,rhonchi or crepitation. No use of accessory muscles of respiration.  CARDIOVASCULAR: S1, S2 normal. No murmurs, rubs, or gallops.  ABDOMEN: Soft,  Right upper quadrant tenderness, nondistended. Bowel sounds present. No organomegaly or mass.  EXTREMITIES: No  pedal edema, cyanosis, or clubbing.  NEUROLOGIC: Cranial nerves II through XII are intact. Muscle strength 5/5 in all extremities. Sensation intact. Gait not checked.  PSYCHIATRIC: The patient is alert and oriented x 3.  SKIN: No obvious rash, lesion, or ulcer.   LABORATORY PANEL:   CBC  Recent Labs Lab 07/09/16 1727  WBC 9.1  HGB 11.7*  HCT 35.2  PLT 433  MCV 82.4  MCH 27.3   MCHC 33.2  RDW 15.3*   ------------------------------------------------------------------------------------------------------------------  Chemistries   Recent Labs Lab 07/09/16 1727  NA 135  K 3.9  CL 103  CO2 25  GLUCOSE 93  BUN 6  CREATININE 0.96  CALCIUM 9.0  AST 38  ALT 59*  ALKPHOS 63  BILITOT 1.3*   ------------------------------------------------------------------------------------------------------------------ estimated creatinine clearance is 75.3 mL/min (by C-G formula based on SCr of 0.96 mg/dL). ------------------------------------------------------------------------------------------------------------------ No results for input(s): TSH, T4TOTAL, T3FREE, THYROIDAB in the last 72 hours.  Invalid input(s): FREET3   Coagulation profile No results for input(s): INR, PROTIME in the last 168 hours. ------------------------------------------------------------------------------------------------------------------- No results for input(s): DDIMER in the last 72 hours. -------------------------------------------------------------------------------------------------------------------  Cardiac Enzymes No results for input(s): CKMB, TROPONINI, MYOGLOBIN in the last 168 hours.  Invalid input(s): CK ------------------------------------------------------------------------------------------------------------------ Invalid input(s): POCBNP  ---------------------------------------------------------------------------------------------------------------  Urinalysis    Component Value Date/Time   COLORURINE YELLOW (A) 07/09/2016 1727   APPEARANCEUR CLEAR (A) 07/09/2016 1727   LABSPEC 1.006 07/09/2016 1727   PHURINE 6.0 07/09/2016 1727   GLUCOSEU NEGATIVE 07/09/2016 1727   HGBUR NEGATIVE 07/09/2016 1727   BILIRUBINUR NEGATIVE 07/09/2016 1727   KETONESUR 5 (A) 07/09/2016 1727   PROTEINUR NEGATIVE 07/09/2016 1727   NITRITE NEGATIVE 07/09/2016 1727   LEUKOCYTESUR TRACE  (A) 07/09/2016 1727     RADIOLOGY: US Abdomen Limited Ruq  Result Date: 07/09/2016 CLINICAL DATA:  46 year old female with acute right upper quadrant pain for 3 days. Initial encounter. EXAM: US ABDOMEN LIMITED - RIGHT UPPER QUADRANT COMPARISON:  06/18/2004 CT. FINDINGS: Gallbladder: Gallbladder wall thickening measuring up to 5.4 mm with small amount of pericholecystic fluid. No gallstones detected. Although patient was not tender over this region per ultrasound technologist, acute cholecystitis is a possibility in the proper clinical setting. Other causes of gallbladder wall thickening and pericholecystic fluid include that secondary to increased right heart pressure or hepatitis felt to be less likely consideration given the patient's acute onset of pain. Common bile duct: Diameter: 4.4 mm. There may be a small amount of sludge within the common bile duct versus a minimal common bile duct thickening. Liver: No focal lesion identified. Within normal limits in parenchymal echogenicity. IMPRESSION: Gallbladder wall thickening with small amount of pericholecystic fluid. No gallstones detected. Acute cholecystitis is a possibility in the proper clinical setting. Please see above. There may be a small amount of sludge within the common bile duct versus a minimal common bile duct thickening. These results will be called to the ordering clinician or representative by the Radiologist Assistant, and communication documented in the PACS or zVision Dashboard. Electronically Signed   By: Genia Del M.D.   On: 07/09/2016 17:13    EKG: No orders found for this or any previous visit.  IMPRESSION AND PLAN: 1. Acute Cholecystis : I will start IV Unasyn   NPO Surgery seen patient Pt will be scheduled for HIDA scan for tomm Pain control   All the records are reviewed and case discussed with ED provider. Management plans discussed with the patient, family and they are in agreement.  CODE  STATUS: Code  Status History    This patient does not have a recorded code status. Please follow your organizational policy for patients in this situation.       TOTAL TIME TAKING CARE OF THIS PATIENT:55 minutes.    Dustin Flock M.D on 07/09/2016 at 8:22 PM  Between 7am to 6pm - Pager - [phone]  After 6pm go to [email or web address] - password [email or web address] [facility]  Office  [phone]  CC: Primary care physician; Nobie Putnam, DO

## 2016-07-09 NOTE — Progress Notes (Signed)
Pharmacy Antibiotic Note  Madison Conway is a 46 y.o. female admitted on 07/09/2016 with  Intra-Abdominal Infection.  Pharmacy has been consulted for Unasyn dosing.  Plan: Will stat patient on Unasyn 3gm IV every 6 hours.   Height: 5\' 4"  (162.6 cm) Weight: 167 lb (75.8 kg) IBW/kg (Calculated) : 54.7  Temp (24hrs), Avg:98.5 F (36.9 C), Min:98.3 F (36.8 C), Max:98.7 F (37.1 C)   Recent Labs Lab 07/09/16 1727  WBC 9.1  CREATININE 0.96    Estimated Creatinine Clearance: 75.3 mL/min (by C-G formula based on SCr of 0.96 mg/dL).    No Known Allergies  Antimicrobials this admission: 1/16 Unasyn  >>   Dose adjustments this admission:   Microbiology results:  Thank you for allowing pharmacy to be a part of this patient's care.  Pernell Dupre, PharmD, BCPS Clinical Pharmacist 07/09/2016 8:34 PM

## 2016-07-09 NOTE — Consult Note (Signed)
Date of Consultation:  07/09/2016  Requesting Physician:  Nance Pear, MD  Reason for Consultation:  Abdominal pain  History of Present Illness: Madison Conway is a 46 y.o. female who presents with a 3 day history of epigastric and right upper quadrant abdominal pain. Patient had been on her usual state of health until 1/13 when she had acute onset of epigastric right upper quadrant abdominal pain after eating. She thought she had some indigestion and took Gas-X, but this caused severe worsening pain. The patient induced emesis which relieved the pain. On 1/14 she still had some lingering pain that she thought was more due to her emesis and was able to tolerate lunch. She did have another episode of pain in the afternoon and again induced vomiting which improved the pain. She also admits to taking several doses of Aleve over these days to help with pain control. She did have one episode of diarrhea on that day. On 1/16 she presented to her PCP had an ultrasound ordered as an outpatient. Ultrasound was done which showed gallbladder wall thickening to 5.4 mm with a very small amount of pericholecystic fluid. However there were no gallstones or sludge detected. She was advised by her PCP to come to the emergency room for further evaluation. Further workup here has revealed a normal white blood cell count of 9.1, a total bilirubin of 1.3, ALT 59, AST 38, alkaline phosphatase 63, and lipase 25.  Of note she thinks that she may have had similar episodes in the past but which were attributed to food poisoning. She is meticulous with the food that she eats and eats more vegetables and homemade meals and nothing particularly greasy. There are no sick contacts. Currently she is having normal bowel movements, and only abdominal pain with no nausea. Denies any fevers, chills, chest pain, shortness of breath. She denies any physical activity or trauma to the right upper quadrant.   Past Medical  History: Past Medical History:  Diagnosis Date  . Asthma    childhood.     Past Surgical History: Past Surgical History:  Procedure Laterality Date  . NO PAST SURGERIES      Home Medications: Prior to Admission medications   Medication Sig Start Date End Date Taking? Authorizing Provider  ALAYCEN 1/35 tablet Take 1 tablet by mouth daily. 09/25/15   Amy Lauren Krebs, NP  ondansetron (ZOFRAN ODT) 4 MG disintegrating tablet Take 1 tablet (4 mg total) by mouth every 8 (eight) hours as needed for nausea or vomiting. 07/09/16   Olin Hauser, DO  sucralfate (CARAFATE) 1 g tablet Take 1 tablet (1 g total) by mouth 4 (four) times daily -  with meals and at bedtime. 07/09/16   Olin Hauser, DO    Allergies: No Known Allergies  Social History:  reports that she has never smoked. She has never used smokeless tobacco. She reports that she drinks alcohol. She reports that she does not use drugs.   Family History: Family History  Problem Relation Age of Onset  . Diabetes Maternal Grandmother     Review of Systems: Review of Systems  Constitutional: Negative for chills and fever.  HENT: Negative for hearing loss.   Eyes: Negative for blurred vision.  Respiratory: Negative for cough and shortness of breath.   Cardiovascular: Negative for chest pain and leg swelling.  Gastrointestinal: Positive for abdominal pain, diarrhea (1 episode only) and vomiting (self induced). Negative for blood in stool, constipation, heartburn and nausea.  Genitourinary: Negative  for dysuria and hematuria.  Musculoskeletal: Negative for myalgias.  Skin: Negative for rash.  Neurological: Negative for dizziness.  Psychiatric/Behavioral: Negative for depression.    Physical Exam BP (!) 161/99   Pulse 80   Temp 98.3 F (36.8 C) (Oral)   Resp 18   Ht 5\' 4"  (1.626 m)   Wt 75.8 kg (167 lb)   LMP 06/25/2016 (Exact Date)   SpO2 100%   BMI 28.67 kg/m  CONSTITUTIONAL: No acute  distress HEENT:  Normocephalic, atraumatic, extraocular motion intact. Nonicteric sclera. NECK: Trachea is midline, and there is no jugular venous distension. RESPIRATORY:  Lungs are clear, and breath sounds are equal bilaterally. Normal respiratory effort without pathologic use of accessory muscles. CARDIOVASCULAR: Heart is regular without murmurs, gallops, or rubs. GI: The abdomen is soft, nondistended, with tenderness to palpation in the epigastric area particularly, and some tenderness in the right upper quadrant. Negative Murphy's sign. MUSCULOSKELETAL:  Normal muscle strength and tone in all four extremities.  No peripheral edema or cyanosis. SKIN: Skin turgor is normal. There are no pathologic skin lesions. No jaundice. NEUROLOGIC:  Motor and sensation is grossly normal.  Cranial nerves are grossly intact. PSYCH:  Alert and oriented to person, place and time. Affect is normal.  Laboratory Analysis: Results for orders placed or performed during the hospital encounter of 07/09/16 (from the past 24 hour(s))  Lipase, blood     Status: None   Collection Time: 07/09/16  5:27 PM  Result Value Ref Range   Lipase 25 11 - 51 U/L  Comprehensive metabolic panel     Status: Abnormal   Collection Time: 07/09/16  5:27 PM  Result Value Ref Range   Sodium 135 135 - 145 mmol/L   Potassium 3.9 3.5 - 5.1 mmol/L   Chloride 103 101 - 111 mmol/L   CO2 25 22 - 32 mmol/L   Glucose, Bld 93 65 - 99 mg/dL   BUN 6 6 - 20 mg/dL   Creatinine, Ser 0.96 0.44 - 1.00 mg/dL   Calcium 9.0 8.9 - 10.3 mg/dL   Total Protein 8.1 6.5 - 8.1 g/dL   Albumin 4.0 3.5 - 5.0 g/dL   AST 38 15 - 41 U/L   ALT 59 (H) 14 - 54 U/L   Alkaline Phosphatase 63 38 - 126 U/L   Total Bilirubin 1.3 (H) 0.3 - 1.2 mg/dL   GFR calc non Af Amer >60 >60 mL/min   GFR calc Af Amer >60 >60 mL/min   Anion gap 7 5 - 15  CBC     Status: Abnormal   Collection Time: 07/09/16  5:27 PM  Result Value Ref Range   WBC 9.1 3.6 - 11.0 K/uL   RBC 4.27  3.80 - 5.20 MIL/uL   Hemoglobin 11.7 (L) 12.0 - 16.0 g/dL   HCT 35.2 35.0 - 47.0 %   MCV 82.4 80.0 - 100.0 fL   MCH 27.3 26.0 - 34.0 pg   MCHC 33.2 32.0 - 36.0 g/dL   RDW 15.3 (H) 11.5 - 14.5 %   Platelets 433 150 - 440 K/uL  Urinalysis, Complete w Microscopic     Status: Abnormal   Collection Time: 07/09/16  5:27 PM  Result Value Ref Range   Color, Urine YELLOW (A) YELLOW   APPearance CLEAR (A) CLEAR   Specific Gravity, Urine 1.006 1.005 - 1.030   pH 6.0 5.0 - 8.0   Glucose, UA NEGATIVE NEGATIVE mg/dL   Hgb urine dipstick NEGATIVE NEGATIVE   Bilirubin  Urine NEGATIVE NEGATIVE   Ketones, ur 5 (A) NEGATIVE mg/dL   Protein, ur NEGATIVE NEGATIVE mg/dL   Nitrite NEGATIVE NEGATIVE   Leukocytes, UA TRACE (A) NEGATIVE   RBC / HPF 0-5 0 - 5 RBC/hpf   WBC, UA NONE SEEN 0 - 5 WBC/hpf   Bacteria, UA NONE SEEN NONE SEEN   Squamous Epithelial / LPF 0-5 (A) NONE SEEN   Mucous PRESENT     Imaging: US Abdomen Limited Ruq  Result Date: 07/09/2016 CLINICAL DATA:  46 year old female with acute right upper quadrant pain for 3 days. Initial encounter. EXAM: US ABDOMEN LIMITED - RIGHT UPPER QUADRANT COMPARISON:  06/18/2004 CT. FINDINGS: Gallbladder: Gallbladder wall thickening measuring up to 5.4 mm with small amount of pericholecystic fluid. No gallstones detected. Although patient was not tender over this region per ultrasound technologist, acute cholecystitis is a possibility in the proper clinical setting. Other causes of gallbladder wall thickening and pericholecystic fluid include that secondary to increased right heart pressure or hepatitis felt to be less likely consideration given the patient's acute onset of pain. Common bile duct: Diameter: 4.4 mm. There may be a small amount of sludge within the common bile duct versus a minimal common bile duct thickening. Liver: No focal lesion identified. Within normal limits in parenchymal echogenicity. IMPRESSION: Gallbladder wall thickening with small  amount of pericholecystic fluid. No gallstones detected. Acute cholecystitis is a possibility in the proper clinical setting. Please see above. There may be a small amount of sludge within the common bile duct versus a minimal common bile duct thickening. These results will be called to the ordering clinician or representative by the Radiologist Assistant, and communication documented in the PACS or zVision Dashboard. Electronically Signed   By: Genia Del M.D.   On: 07/09/2016 17:13    Assessment and Plan: This is a 46 y.o. female who presents with epigastric and right upper quadrant abdominal pain.  I have personally reviewed the patient's laboratory and imaging studies and discussed them with the patient.  Currently it it is unclear that the source of her pain is related to her gallbladder. Her symptoms are somewhat atypical in that she only has pain with no real nausea and only self-induced vomiting. She has a normal white blood cell count.  Although on ultrasound she may have gallbladder wall thickening with a small amount of pericholecystic fluid, there are no gallstones visible.  At this point would recommend admission under the hospitalist team for further workup of the patient's abdominal pain. I would recommend obtaining a HIDA scan to further evaluate her gallbladder. If this were positive then I discussed with the patient that the next step would be discussing surgical options. If the HIDA scan is negative for cholecystitis, then possibly a GI consult would be needed for further evaluation. The patient does understand this plan and all of her questions have been answered.   Melvyn Neth, [place]

## 2016-07-09 NOTE — ED Provider Notes (Signed)
[facility] Emergency Department Provider Note   ____________________________________________   I have reviewed the triage vital signs and the nursing notes.   HISTORY  Chief Complaint Abdominal Pain   History limited by: Not Limited   HPI Madison Conway is a 46 y.o. female who presents to the emergency department today from ultrasound after a right upper quadrant ultrasound showed possible signs of acute cholecystitis. Patient went to her primary care doctor today because of concerns for abdominal pain. Think going on for the past few days. It is located primarily in the epigastric right upper quadrants. It is worse after the patient is tried to eat. Patient has had some associated nausea and vomiting. She did have diarrhea one day although not in the past 24 hours. Patient states she had had similar pain once or twice in the past but had always attributed it to food poisoning. No fevers.   Past Medical History:  Diagnosis Date  . Asthma    childhood.    Patient Active Problem List   Diagnosis Date Noted  . RUQ abdominal pain 07/09/2016  . Iron deficiency anemia 12/07/2015    History reviewed. No pertinent surgical history.  Prior to Admission medications   Medication Sig Start Date End Date Taking? Authorizing Provider  ALAYCEN 1/35 tablet Take 1 tablet by mouth daily. 09/25/15   Amy Lauren Krebs, NP  ondansetron (ZOFRAN ODT) 4 MG disintegrating tablet Take 1 tablet (4 mg total) by mouth every 8 (eight) hours as needed for nausea or vomiting. 07/09/16   Olin Hauser, DO  sucralfate (CARAFATE) 1 g tablet Take 1 tablet (1 g total) by mouth 4 (four) times daily -  with meals and at bedtime. 07/09/16   Olin Hauser, DO    Allergies Patient has no known allergies.  Family History  Problem Relation Age of Onset  . Diabetes Maternal Grandmother     Social History Social History  Substance Use Topics  . Smoking status: Never  Smoker  . Smokeless tobacco: Never Used  . Alcohol use Yes     Comment: occ.    Review of Systems  Constitutional: Negative for fever. Cardiovascular: Negative for chest pain. Respiratory: Negative for shortness of breath. Gastrointestinal: Positive for abdominal pain. Genitourinary: Negative for dysuria. Musculoskeletal: Negative for back pain. Skin: Negative for rash. Neurological: Negative for headaches, focal weakness or numbness.  10-point ROS otherwise negative.  ____________________________________________   PHYSICAL EXAM:  VITAL SIGNS: ED Triage Vitals  Enc Vitals Group     BP 07/09/16 1713 (!) 159/100     Pulse Rate 07/09/16 1713 84     Resp 07/09/16 1713 18     Temp 07/09/16 1713 98.3 F (36.8 C)     Temp Source 07/09/16 1713 Oral     SpO2 07/09/16 1713 100 %     Weight 07/09/16 1713 167 lb (75.8 kg)     Height 07/09/16 1713 5\' 4"  (1.626 m)     Head Circumference --      Peak Flow --      Pain Score 07/09/16 1724 4   Constitutional: Alert and oriented. Well appearing and in no distress. Eyes: Conjunctivae are normal. Normal extraocular movements. ENT   Head: Normocephalic and atraumatic.   Nose: No congestion/rhinnorhea.   Mouth/Throat: Mucous membranes are moist.   Neck: No stridor. Hematological/Lymphatic/Immunilogical: No cervical lymphadenopathy. Cardiovascular: Normal rate, regular rhythm.  No murmurs, rubs, or gallops. Respiratory: Normal respiratory effort without tachypnea nor retractions. Breath  sounds are clear and equal bilaterally. No wheezes/rales/rhonchi. Gastrointestinal: Soft. Tender to palpation in the right upper quadrant and epigastric region.  Genitourinary: Deferred Musculoskeletal: Normal range of motion in all extremities. No lower extremity edema. Neurologic:  Normal speech and language. No gross focal neurologic deficits are appreciated.  Skin:  Skin is warm, dry and intact. No rash noted. Psychiatric: Mood and  affect are normal. Speech and behavior are normal. Patient exhibits appropriate insight and judgment.  ____________________________________________    LABS (pertinent positives/negatives)  Labs Reviewed  COMPREHENSIVE METABOLIC PANEL - Abnormal; Notable for the following:       Result Value   ALT 59 (*)    Total Bilirubin 1.3 (*)    All other components within normal limits  CBC - Abnormal; Notable for the following:    Hemoglobin 11.7 (*)    RDW 15.3 (*)    All other components within normal limits  URINALYSIS, COMPLETE (UACMP) WITH MICROSCOPIC - Abnormal; Notable for the following:    Color, Urine YELLOW (*)    APPearance CLEAR (*)    Ketones, ur 5 (*)    Leukocytes, UA TRACE (*)    Squamous Epithelial / LPF 0-5 (*)    All other components within normal limits  LIPASE, BLOOD     ____________________________________________   EKG  None  ____________________________________________    RADIOLOGY  RUQ US  IMPRESSION: Gallbladder wall thickening with small amount of pericholecystic fluid. No gallstones detected. Acute cholecystitis is a possibility in the proper clinical setting. Please see above.  There may be a small amount of sludge within the common bile duct versus a minimal common bile duct thickening.   ____________________________________________   PROCEDURES  Procedures  ____________________________________________   INITIAL IMPRESSION / ASSESSMENT AND PLAN / ED COURSE  Pertinent labs & imaging results that were available during my care of the patient were reviewed by me and considered in my medical decision making (see chart for details).  Patient presented to the emergency department today because of concerns for abdominal pain and ultrasound which showed gallbladder wall thickening and pericholecystic fluid. Discussed with surgery. In lieu of no stones seen but she would best be served on hospital admission and get a HIDA scan. Will admit  to the hospital service.  ____________________________________________   FINAL CLINICAL IMPRESSION(S) / ED DIAGNOSES  Final diagnoses:  Right upper quadrant abdominal pain     Note: This dictation was prepared with Dragon dictation. Any transcriptional errors that result from this process are unintentional     Nance Pear, MD 07/09/16 2234

## 2016-07-10 ENCOUNTER — Observation Stay: Payer: BLUE CROSS/BLUE SHIELD

## 2016-07-10 DIAGNOSIS — R1011 Right upper quadrant pain: Secondary | ICD-10-CM | POA: Diagnosis not present

## 2016-07-10 DIAGNOSIS — R109 Unspecified abdominal pain: Secondary | ICD-10-CM | POA: Diagnosis not present

## 2016-07-10 LAB — CBC WITH DIFFERENTIAL/PLATELET
BASOS: 0 %
Basophils Absolute: 0 10*3/uL (ref 0.0–0.2)
EOS (ABSOLUTE): 0.3 10*3/uL (ref 0.0–0.4)
EOS: 4 %
HEMATOCRIT: 34.5 % (ref 34.0–46.6)
HEMOGLOBIN: 11 g/dL — AB (ref 11.1–15.9)
IMMATURE GRANS (ABS): 0 10*3/uL (ref 0.0–0.1)
IMMATURE GRANULOCYTES: 0 %
LYMPHS: 24 %
Lymphocytes Absolute: 2.2 10*3/uL (ref 0.7–3.1)
MCH: 26.4 pg — AB (ref 26.6–33.0)
MCHC: 31.9 g/dL (ref 31.5–35.7)
MCV: 83 fL (ref 79–97)
MONOCYTES: 7 %
Monocytes Absolute: 0.6 10*3/uL (ref 0.1–0.9)
NEUTROS ABS: 6.1 10*3/uL (ref 1.4–7.0)
Neutrophils: 65 %
Platelets: 402 10*3/uL — ABNORMAL HIGH (ref 150–379)
RBC: 4.17 x10E6/uL (ref 3.77–5.28)
RDW: 15.1 % (ref 12.3–15.4)
WBC: 9.3 10*3/uL (ref 3.4–10.8)

## 2016-07-10 LAB — COMPREHENSIVE METABOLIC PANEL
A/G RATIO: 1.4 (ref 1.2–2.2)
ALBUMIN: 4.2 g/dL (ref 3.5–5.5)
ALT: 59 IU/L — AB (ref 0–32)
AST: 35 IU/L (ref 0–40)
Alkaline Phosphatase: 70 IU/L (ref 39–117)
BILIRUBIN TOTAL: 0.9 mg/dL (ref 0.0–1.2)
BUN / CREAT RATIO: 5 — AB (ref 9–23)
BUN: 5 mg/dL — ABNORMAL LOW (ref 6–24)
CHLORIDE: 99 mmol/L (ref 96–106)
CO2: 23 mmol/L (ref 18–29)
Calcium: 9.2 mg/dL (ref 8.7–10.2)
Creatinine, Ser: 1.01 mg/dL — ABNORMAL HIGH (ref 0.57–1.00)
GFR calc non Af Amer: 68 mL/min/{1.73_m2} (ref >59)
GFR, EST AFRICAN AMERICAN: 79 mL/min/{1.73_m2} (ref >59)
Globulin, Total: 3.1 g/dL (ref 1.5–4.5)
Glucose: 86 mg/dL (ref 65–99)
POTASSIUM: 4.2 mmol/L (ref 3.5–5.2)
Sodium: 138 mmol/L (ref 134–144)
TOTAL PROTEIN: 7.3 g/dL (ref 6.0–8.5)

## 2016-07-10 LAB — BASIC METABOLIC PANEL
Anion gap: 6 (ref 5–15)
BUN: 6 mg/dL (ref 6–20)
CHLORIDE: 108 mmol/L (ref 101–111)
CO2: 25 mmol/L (ref 22–32)
Calcium: 8.4 mg/dL — ABNORMAL LOW (ref 8.9–10.3)
Creatinine, Ser: 0.85 mg/dL (ref 0.44–1.00)
GFR calc Af Amer: >60 mL/min (ref >60)
GFR calc non Af Amer: >60 mL/min (ref >60)
GLUCOSE: 85 mg/dL (ref 65–99)
POTASSIUM: 4.1 mmol/L (ref 3.5–5.1)
Sodium: 139 mmol/L (ref 135–145)

## 2016-07-10 LAB — CBC
HEMATOCRIT: 32.3 % — AB (ref 35.0–47.0)
Hemoglobin: 10.8 g/dL — ABNORMAL LOW (ref 12.0–16.0)
MCH: 27.5 pg (ref 26.0–34.0)
MCHC: 33.4 g/dL (ref 32.0–36.0)
MCV: 82.5 fL (ref 80.0–100.0)
Platelets: 382 10*3/uL (ref 150–440)
RBC: 3.91 MIL/uL (ref 3.80–5.20)
RDW: 15.3 % — ABNORMAL HIGH (ref 11.5–14.5)
WBC: 7.5 10*3/uL (ref 3.6–11.0)

## 2016-07-10 LAB — LIPASE: LIPASE: 27 U/L (ref 14–72)

## 2016-07-10 MED ORDER — TECHNETIUM TC 99M MEBROFENIN IV KIT
5.0000 | PACK | Freq: Once | INTRAVENOUS | Status: AC | PRN
  Administered 2016-07-10: 5.288 via INTRAVENOUS

## 2016-07-10 NOTE — Discharge Instructions (Signed)

## 2016-07-10 NOTE — Progress Notes (Signed)
IV and tele were removed. Discharge instructions and follow-up appointments were provided to the pt and spouse at bedside. The pt refused a wheelchair and walked downstairs.

## 2016-07-11 ENCOUNTER — Encounter: Payer: Self-pay | Admitting: Family Medicine

## 2016-07-12 ENCOUNTER — Telehealth: Payer: Self-pay | Admitting: Family Medicine

## 2016-07-12 DIAGNOSIS — R1011 Right upper quadrant pain: Secondary | ICD-10-CM

## 2016-07-12 DIAGNOSIS — K819 Cholecystitis, unspecified: Secondary | ICD-10-CM

## 2016-07-12 NOTE — Telephone Encounter (Signed)
Recent course, I saw patient in office on 07/09/16 for acute RUQ abdominal pain, sent for RUQ Abdominal US and with identified results concerning for acalculous cholecystitis, she was advised while still in [facility] to go to ED, she was admitted on 07/09/16 to hospitalist service, treated for cholecystitis with IV antibiotics Unasyn and IVF, and had general surgery consultation, HIDA scan was obtained which showed normal gallbladder function, see report for full details. She was discharged on 07/10/16 following day with significant clinical improvement, she did not receive oral antibiotic course on discharge and was advised to follow-up with GI outpatient.  Currently as of today 07/12/16, I do not see available Discharge Summary in review of her chart, instead I contacted patient and spoke with Madison Conway in response to her mychart follow-up question today. She reviewed the above hospital course with me. She currently admits to being pain free and feeling much improved. Denies abdominal pain, nausea, fevers/chills or any other concerning symptoms. She is able to return to work and function normally. She is adhering to diet restrictions with no fatty / fried foods, also advised to no longer take Aleve/Ibuprofen for now to avoid potential symptoms if has PUD (considered by hospitalist team). She is asking about follow-up with GI.  I advised her that she can schedule hospital follow-up with me in 1-2 weeks, we can discuss her previously elevated BP at that time. Next, she will need referral to GI, prefers to see Madison Conway GI Dr Gaylyn Cheers as advised in hospital, will go ahead and place referral today, essentially need further evaluation to help determine likely cause her for acalculous cholecystitis and determine if other etiology of her symptoms.  Referral placed in Epic today, patient notified, follow-up as planned.  Nobie Putnam, [facility] 07/12/2016, 12:22  PM

## 2016-07-13 NOTE — Discharge Summary (Signed)
[facility] [place] NAME: Madison Conway    MR#:  [id number]  DATE OF BIRTH:  1971-04-08  DATE OF ADMISSION:  07/09/2016   ADMITTING PHYSICIAN: Dustin Flock, MD  DATE OF DISCHARGE: 07/10/2016  2:22 PM  PRIMARY CARE PHYSICIAN: Nobie Putnam, DO   ADMISSION DIAGNOSIS:  Right upper quadrant abdominal pain [R10.11] Abdominal pain [R10.9] DISCHARGE DIAGNOSIS:  Active Problems:   Acalculous cholecystitis  SECONDARY DIAGNOSIS:   Past Medical History:  Diagnosis Date  . Asthma    childhood.   HOSPITAL COURSE:  46 y.o. female who presents with epigastric and right upper quadrant abdominal pain.    * Acalculous Cholecystitis: Currently it it is unclear that the source of her pain is related to her gallbladder. Her symptoms are somewhat atypical in that she only has pain with no real nausea and only self-induced vomiting. She has a normal white blood cell count.  Although on ultrasound she may have gallbladder wall thickening with a small amount of pericholecystic fluid, there are no gallstones visible. - Neg HIDA scan  - She tolerated diet without any recurrence of symptoms - Outpt GI eval/F-UP requested for further evaluation. The patient does understand this plan and all of her questions have been answered. - diet restrictions with no fatty / fried foods DISCHARGE CONDITIONS:  stable CONSULTS OBTAINED:  Treatment Team:  Olean Ree, MD DRUG ALLERGIES:  No Known Allergies DISCHARGE MEDICATIONS:   Allergies as of 07/10/2016   No Known Allergies     Medication List    TAKE these medications   ondansetron 4 MG disintegrating tablet Commonly known as:  ZOFRAN ODT Take 1 tablet (4 mg total) by mouth every 8 (eight) hours as needed for nausea or vomiting.   sucralfate 1 g tablet Commonly known as:  CARAFATE Take 1 tablet (1 g total) by mouth 4 (four) times daily -  with meals and at bedtime.        DISCHARGE  INSTRUCTIONS:   DIET:  no fatty / fried foods DISCHARGE CONDITION:  Good ACTIVITY:  Activity as tolerated OXYGEN:  Home Oxygen: No.  Oxygen Delivery: room air DISCHARGE LOCATION:  home   If you experience worsening of your admission symptoms, develop shortness of breath, life threatening emergency, suicidal or homicidal thoughts you must seek medical attention immediately by calling 911 or calling your MD immediately  if symptoms less severe.  You Must read complete instructions/literature along with all the possible adverse reactions/side effects for all the Medicines you take and that have been prescribed to you. Take any new Medicines after you have completely understood and accpet all the possible adverse reactions/side effects.   Please note  You were cared for by a hospitalist during your hospital stay. If you have any questions about your discharge medications or the care you received while you were in the hospital after you are discharged, you can call the unit and asked to speak with the hospitalist on call if the hospitalist that took care of you is not available. Once you are discharged, your primary care physician will handle any further medical issues. Please note that NO REFILLS for any discharge medications will be authorized once you are discharged, as it is imperative that you return to your primary care physician (or establish a relationship with a primary care physician if you do not have one) for your aftercare needs so that they can reassess your need for medications and monitor your lab  values.    On the day of Discharge:  VITAL SIGNS:  Blood pressure 139/88, pulse 78, temperature 97.9 F (36.6 C), temperature source Oral, resp. rate 16, height 5\' 4"  (1.626 m), weight 75.8 kg (167 lb), last menstrual period 06/25/2016, SpO2 100 %. PHYSICAL EXAMINATION:  GENERAL:  46 y.o.-year-old patient lying in the bed with no acute distress.  EYES: Pupils equal, round, reactive  to light and accommodation. No scleral icterus. Extraocular muscles intact.  HEENT: Head atraumatic, normocephalic. Oropharynx and nasopharynx clear.  NECK:  Supple, no jugular venous distention. No thyroid enlargement, no tenderness.  LUNGS: Normal breath sounds bilaterally, no wheezing, rales,rhonchi or crepitation. No use of accessory muscles of respiration.  CARDIOVASCULAR: S1, S2 normal. No murmurs, rubs, or gallops.  ABDOMEN: Soft, non-tender, non-distended. Bowel sounds present. No organomegaly or mass.  EXTREMITIES: No pedal edema, cyanosis, or clubbing.  NEUROLOGIC: Cranial nerves II through XII are intact. Muscle strength 5/5 in all extremities. Sensation intact. Gait not checked.  PSYCHIATRIC: The patient is alert and oriented x 3.  SKIN: No obvious rash, lesion, or ulcer.  DATA REVIEW:   CBC  Recent Labs Lab 07/10/16 0603  WBC 7.5  HGB 10.8*  HCT 32.3*  PLT 382    Chemistries   Recent Labs Lab 07/09/16 1727 07/10/16 0603  NA 135 139  K 3.9 4.1  CL 103 108  CO2 25 25  GLUCOSE 93 85  BUN 6 6  CREATININE 0.96 0.85  CALCIUM 9.0 8.4*  AST 38  --   ALT 59*  --   ALKPHOS 63  --   BILITOT 1.3*  --      Follow-up Information    Nobie Putnam, DO. Call in 1 week(s).   Specialty:  Family Medicine Why:  Please call and schedule an appointment with the office reopens.  You need to be seen in one week.  [phone] Contact information: [place] [address] [phone]        Gaylyn Cheers, MD. Schedule an appointment as soon as possible for a visit in 1 week(s).   Specialty:  Gastroenterology Why:  Please call an schedule your hospital follow-up. Thank you! Contact information: [place] [address] [phone]           Management plans discussed with the patient, family and they are in agreement.  CODE STATUS:  Code Status History    Date Active Date  Inactive Code Status Order ID Comments User Context   07/09/2016  8:29 PM 07/10/2016  5:23 PM Full Code [id number]  Dustin Flock, MD ED      TOTAL TIME TAKING CARE OF THIS PATIENT: 45 minutes.    Max Sane M.D on 07/13/2016 at 7:06 PM  Between 7am to 6pm - Pager - [phone]  After 6pm go to [email or web address] - Proofreader  [facility]  Office  [phone]  CC: Primary care physician; Nobie Putnam, DO   Note: This dictation was prepared with Dragon dictation along with smaller phrase technology. Any transcriptional errors that result from this process are unintentional.

## 2016-07-15 ENCOUNTER — Encounter: Payer: Self-pay | Admitting: Family Medicine

## 2016-07-19 ENCOUNTER — Encounter: Payer: Self-pay | Admitting: Family Medicine

## 2016-07-22 ENCOUNTER — Other Ambulatory Visit: Payer: Self-pay | Admitting: Gastroenterology

## 2016-07-22 ENCOUNTER — Other Ambulatory Visit: Payer: Self-pay | Admitting: Family Medicine

## 2016-07-22 DIAGNOSIS — R1011 Right upper quadrant pain: Secondary | ICD-10-CM | POA: Diagnosis not present

## 2016-07-22 DIAGNOSIS — R1013 Epigastric pain: Secondary | ICD-10-CM

## 2016-07-22 DIAGNOSIS — Z3041 Encounter for surveillance of contraceptive pills: Secondary | ICD-10-CM

## 2016-07-22 MED ORDER — NORETHINDRONE-ETH ESTRADIOL 1-35 MG-MCG PO TABS: 1.0000 | ORAL_TABLET | Freq: Every day | ORAL | 11 refills | Status: DC

## 2016-07-23 ENCOUNTER — Encounter: Payer: Self-pay | Admitting: Family Medicine

## 2016-07-24 ENCOUNTER — Encounter: Payer: Self-pay | Admitting: Family Medicine

## 2016-07-26 ENCOUNTER — Ambulatory Visit
Admission: RE | Admit: 2016-07-26 | Discharge: 2016-07-26 | Disposition: A | Discharge status: Home / Self Care | Payer: BLUE CROSS/BLUE SHIELD | Source: Ambulatory Visit | Attending: Gastroenterology | Admitting: Gastroenterology

## 2016-07-26 DIAGNOSIS — R1011 Right upper quadrant pain: Secondary | ICD-10-CM

## 2016-07-26 DIAGNOSIS — K219 Gastro-esophageal reflux disease without esophagitis: Secondary | ICD-10-CM | POA: Diagnosis not present

## 2016-07-26 DIAGNOSIS — R1013 Epigastric pain: Secondary | ICD-10-CM | POA: Diagnosis present

## 2016-07-30 ENCOUNTER — Encounter: Payer: Self-pay | Admitting: Family Medicine

## 2016-08-16 ENCOUNTER — Other Ambulatory Visit: Payer: Self-pay | Admitting: Family Medicine

## 2016-08-16 DIAGNOSIS — K8050 Calculus of bile duct without cholangitis or cholecystitis without obstruction: Secondary | ICD-10-CM

## 2016-08-16 DIAGNOSIS — R1011 Right upper quadrant pain: Secondary | ICD-10-CM

## 2016-08-16 MED ORDER — SUCRALFATE 1 G PO TABS: 1.0000 g | ORAL_TABLET | Freq: Three times a day (TID) | ORAL | 2 refills | Status: DC

## 2016-08-16 NOTE — Telephone Encounter (Signed)
Refilled Carafate QID WC PRN #[id number] +2 refills for use in future with eating spicy or trigger foods.  Nobie Putnam, [facility] 08/16/2016, 12:02 PM

## 2016-09-23 ENCOUNTER — Encounter: Payer: Self-pay | Admitting: Family Medicine

## 2016-10-07 ENCOUNTER — Encounter: Payer: Self-pay | Admitting: Nurse Practitioner

## 2016-10-07 ENCOUNTER — Other Ambulatory Visit: Payer: Self-pay | Admitting: Nurse Practitioner

## 2016-10-07 ENCOUNTER — Ambulatory Visit (INDEPENDENT_AMBULATORY_CARE_PROVIDER_SITE_OTHER): Payer: BLUE CROSS/BLUE SHIELD | Admitting: Nurse Practitioner

## 2016-10-07 VITALS — BP 128/86 | HR 87 | Temp 98.5°F | Resp 16 | Ht 64.0 in | Wt 167.0 lb

## 2016-10-07 DIAGNOSIS — Z0000 Encounter for general adult medical examination without abnormal findings: Secondary | ICD-10-CM | POA: Diagnosis not present

## 2016-10-07 DIAGNOSIS — D508 Other iron deficiency anemias: Secondary | ICD-10-CM

## 2016-10-07 DIAGNOSIS — Z3041 Encounter for surveillance of contraceptive pills: Secondary | ICD-10-CM | POA: Diagnosis not present

## 2016-10-07 LAB — CBC WITH DIFFERENTIAL/PLATELET
Basophils Absolute: 67 cells/uL (ref 0–200)
Basophils Relative: 1 %
Eosinophils Absolute: 268 cells/uL (ref 15–500)
Eosinophils Relative: 4 %
HCT: 37.6 % (ref 35.0–45.0)
Hemoglobin: 11.9 g/dL (ref 11.7–15.5)
Lymphocytes Relative: 26 %
Lymphs Abs: 1742 cells/uL (ref 850–3900)
MCH: 26.9 pg — ABNORMAL LOW (ref 27.0–33.0)
MCHC: 31.6 g/dL — ABNORMAL LOW (ref 32.0–36.0)
MCV: 84.9 fL (ref 80.0–100.0)
MPV: 8.2 fL (ref 7.5–12.5)
Monocytes Absolute: 469 cells/uL (ref 200–950)
Monocytes Relative: 7 %
Neutro Abs: 4154 cells/uL (ref 1500–7800)
Neutrophils Relative %: 62 %
Platelets: 439 10*3/uL — ABNORMAL HIGH (ref 140–400)
RBC: 4.43 MIL/uL (ref 3.80–5.10)
RDW: 15.5 % — ABNORMAL HIGH (ref 11.0–15.0)
WBC: 6.7 10*3/uL (ref 3.8–10.8)

## 2016-10-07 MED ORDER — NORETHINDRONE-ETH ESTRADIOL 1-35 MG-MCG PO TABS: 1.0000 | ORAL_TABLET | Freq: Every day | ORAL | 11 refills | Status: DC

## 2016-10-07 NOTE — Patient Instructions (Signed)
Madison Conway, Thank you for coming in to clinic today.  1. Contraception:  I have sent a refill for your OCP.  If you want to consider another option as you move toward menopause, let me know.  We can refer you to GYN for IUD placement if that is what you decide.  I have also provided information about implant contraception.  They function very similarly to IUDs but go under the skin in your arm.  Please schedule a follow-up appointment with Cassell Smiles, AGNP in 1 year for your annual exam.   If you have any other questions or concerns, please feel free to call the clinic or send a message through [facility]. You may also schedule an earlier appointment if necessary.  Cassell Smiles, DNP, AGNP-BC Adult Gerontology Nurse Practitioner [facility], [facility]   Contraceptive Implant Information A contraceptive implant is a small, plastic rod that is inserted under the skin. The implant releases a hormone into the bloodstream that prevents pregnancy. Contraceptive implants can be effective for up to 3 years. They do not provide protection against STIs (sexually transmitted infections). How does the implant work? Contraceptive implants prevent pregnancy by releasing a small amount of progestin into the bloodstream. Progestin has similar effects to the hormone progesterone, which plays a role in menstrual periods and pregnancy. Progestin will:  Stop the ovaries from releasing eggs.  Thicken cervical mucus to prevent sperm from entering the cervix.  Thin out the lining of the uterus to prevent a fertilized egg from attaching to the wall of the uterus. What are the advantages of this form of birth control? The advantages of this form of birth control include the following:  It is very effective at preventing pregnancy.  It is effective for up to 3 years.  It can easily be removed.  It does not interfere with sex or daily activities.  It can be used when breastfeeding.  It can be  used by women who cannot take estrogen.  The procedure to insert the device is quick.  Women can get pregnant shortly after removing the device. What are the disadvantages of this form of birth control? The disadvantages of this form of birth control include the following:  It can cause side effects, including:  Irregular menstrual periods or bleeding.  Headache.  Weight gain.  Acne.  Breast tenderness.  Abdomen (abdominal) pain.  Mood changes, such as depression.  It does not protect against STIs.  You must make an office visit to have it inserted and removed by a trained clinician.  Inserting or removing the device can result in pain, scarring, and tissue or nerve damage (rare). How is this implant inserted? The procedure to insert an implant only takes a few minutes. During the procedure:  Your upper arm will be numbed with a numbing medicine (local anesthetic).  The implant will be injected under the skin of your upper arm with a needle. After the procedure:  You may experience minor bruising, swelling, or discomfort at the insertion site. This should only last for a couple of days.  You may need to use another, non-hormonal contraceptive such as a condom for 7 days after the procedure. How is the implant removed? The implant should be removed after 3 years or as directed by your health care provider. The procedure to remove the implant only takes a few minutes. During this procedure:  Your upper arm will be numbed with a local anesthetic.  A small incision will be made near  the implant.  The implant will be removed with a small pair of forceps. After the implant is removed:  The effect of the implant will wear off a few hours after removal. Most women will be able to get pregnant within 3 weeks of removal.  A new implant can be inserted as soon as the old one is removed, if desired.  You may experience minor bruising, swelling, or discomfort at the removal  site. This should only last for a couple of days. Is this implant right for me? Your health care provider can help you determine whether you are good candidate for a contraceptive implant. Make sure to discuss the possible side effects with your health care provider. You should not get the implant if you:  Are pregnant.  Are allergic to any part of the implant.  Have a history of:  Breast cancer.  Unusual bleeding from the vagina.  Heart disease.  Stroke.  Liver disease or tumors.  Migraines. Summary  A contraceptive implant is a small, plastic rod that is inserted under the skin. The implant releases a hormone into the bloodstream that prevents pregnancy.  Contraceptive implants can be effective for up to 3 years.  The implant works by preventing ovaries from releasing eggs, thickening the cervical mucus, and thinning the uterine wall.  This form of birth control is very effective at preventing pregnancy and can be inserted and removed quickly. Women can get pregnant shortly after the device is removed.  This form of birth control can cause some side effects, including weight gain, breast tenderness, headaches, irregular periods or bleeding, acne, abdominal pain, and depression. It does not provide protection against STIs (sexually transmitted infections). This information is not intended to replace advice given to you by your health care provider. Make sure you discuss any questions you have with your health care provider. Document Released: 05/30/2011 Document Revised: 05/25/2016 Document Reviewed: 05/25/2016 Elsevier Interactive Patient Education  2017 [place].    Intrauterine Device Information An intrauterine device (IUD) is inserted into your uterus to prevent pregnancy. There are two types of IUDs available:  Copper IUD-This type of IUD is wrapped in copper wire and is placed inside the uterus. Copper makes the uterus and fallopian tubes produce a fluid that  kills sperm. The copper IUD can stay in place for 10 years.  Hormone IUD-This type of IUD contains the hormone progestin (synthetic progesterone). The hormone thickens the cervical mucus and prevents sperm from entering the uterus. It also thins the uterine lining to prevent implantation of a fertilized egg. The hormone can weaken or kill the sperm that get into the uterus. One type of hormone IUD can stay in place for 5 years, and another type can stay in place for 3 years. Your health care provider will make sure you are a good candidate for a contraceptive IUD. Discuss with your health care provider the possible side effects. Advantages of an intrauterine device  IUDs are highly effective, reversible, long acting, and low maintenance.  There are no estrogen-related side effects.  An IUD can be used when breastfeeding.  IUDs are not associated with weight gain.  The copper IUD works immediately after insertion.  The hormone IUD works right away if inserted within 7 days of your period starting. You will need to use a backup method of birth control for 7 days if the hormone IUD is inserted at any other time in your cycle.  The copper IUD does not interfere with your  female hormones.  The hormone IUD can make heavy menstrual periods lighter and decrease cramping.  The hormone IUD can be used for 3 or 5 years.  The copper IUD can be used for 10 years. Disadvantages of an intrauterine device  The hormone IUD can be associated with irregular bleeding patterns.  The copper IUD can make your menstrual flow heavier and more painful.  You may experience cramping and vaginal bleeding after insertion. This information is not intended to replace advice given to you by your health care provider. Make sure you discuss any questions you have with your health care provider. Document Released: 05/14/2004 Document Revised: 11/16/2015 Document Reviewed: 11/29/2012 Elsevier Interactive Patient  Education  2017 [employer].

## 2016-10-07 NOTE — Progress Notes (Signed)
I have reviewed this encounter including the documentation in this note and/or discussed this patient with the provider, Cassell Smiles, AGPCNP-BC. I am certifying that I agree with the content of this note as supervising physician.  Nobie Putnam, [facility] 10/07/2016, 12:02 PM

## 2016-10-07 NOTE — Progress Notes (Signed)
Subjective:    Patient ID: Madison Conway, female    DOB: 25-Oct-1970, 46 y.o.   MRN: [id number]  Madison Conway is a 46 y.o. female presenting on 10/07/2016 for Annual Exam   HPI  GI issues from January have completely resolved.  She states her BP is a little high today.  It isnormally lower outside of clinic.  Readings are usually around 115-125 / 80-85 at home. Pt denies headache, lightheadedness, dizziness, changes in vision, chest tightness/pressure, palpitations, sudden loss of speech or loss of consiousness.  Patient has a history of iron deficiency anemia. She doesn't have any symptoms when she takes iron pills and eats spinach.  Today she feels ok without concerns for iron deficiency  Contraception management: She has been using OCP since she was 46 years old and is still satisfied. Has previously considered IUD.  Discussed hormonal options and copper. She is still comfortable with using her OCP. She does not miss doses and feels security knowing she is in control. She expresses doubts about IUD working if she isn't doing anything daily.  Health Maintenance: PAP: May 2015 = 3 years  LMP: 2 weeks ago Endorses regular seatbelt use, daily face sunscreen, and sunscreen on exposed areas during summer regularly.  Past Medical History:  Diagnosis Date  . Asthma    childhood.   Past Surgical History:  Procedure Laterality Date  . NO PAST SURGERIES     Social History   Social History  . Marital status: Single    Spouse name: N/A  . Number of children: N/A  . Years of education: N/A   Occupational History  . Not on file.   Social History Main Topics  . Smoking status: Never Smoker  . Smokeless tobacco: Never Used  . Alcohol use 1.2 - 1.8 oz/week    2 - 3 Glasses of wine per week  . Drug use: No  . Sexual activity: Yes    Birth control/ protection: Pill   Other Topics Concern  . Not on file   Social History Narrative  . No narrative on file   Family  History  Problem Relation Age of Onset  . Diabetes Maternal Grandmother   . Breast cancer Neg Hx   . Colon cancer Neg Hx   . Ovarian cancer Neg Hx    Current Outpatient Prescriptions on File Prior to Visit  Medication Sig  . sucralfate (CARAFATE) 1 g tablet Take 1 tablet (1 g total) by mouth 4 (four) times daily -  with meals and at bedtime.   No current facility-administered medications on file prior to visit.     Review of Systems  Constitutional: Negative.   HENT: Negative.   Eyes: Negative.   Respiratory: Negative.   Cardiovascular: Negative.   Gastrointestinal: Negative.   Endocrine: Negative.   Genitourinary: Negative.   Musculoskeletal: Negative.   Skin: Negative.   Allergic/Immunologic: Negative.   Neurological: Negative.   Hematological: Negative.   Psychiatric/Behavioral: Negative.    Per HPI unless specifically indicated above     Objective:    BP (!) 152/98 (BP Location: Left Arm, Patient Position: Sitting, Cuff Size: Normal)   Pulse 87   Temp 98.5 F (36.9 C) (Oral)   Resp 16   Ht 5\' 4"  (1.626 m)   Wt 167 lb (75.8 kg)   BMI 28.67 kg/m    BP recheck: 128/86  Wt Readings from Last 3 Encounters:  10/07/16 167 lb (75.8 kg)  07/10/16 167 lb (75.8  kg)  07/09/16 167 lb 9.6 oz (76 kg)    Physical Exam  Constitutional: She is oriented to person, place, and time. She appears well-developed and well-nourished. No distress.  HENT:  Head: Normocephalic and atraumatic.  Right Ear: Tympanic membrane, external ear and ear canal normal.  Left Ear: Tympanic membrane, external ear and ear canal normal.  Nose: Nose normal.  Mouth/Throat: Oropharynx is clear and moist.  Eyes: Conjunctivae and EOM are normal. Pupils are equal, round, and reactive to light.  Neck: Normal range of motion. Neck supple. No JVD present. No tracheal deviation present. Thyromegaly present.  slightly enlarged thyroid.  Not visible to naked eye  Cardiovascular: Normal rate, regular  rhythm, normal heart sounds and intact distal pulses.   Pulmonary/Chest: Effort normal and breath sounds normal.  Abdominal: Soft. Bowel sounds are normal. She exhibits no distension. There is no tenderness. Hernia confirmed negative in the right inguinal area.  Genitourinary: Vagina normal and uterus normal. No breast swelling, tenderness, discharge or bleeding. Pelvic exam was performed with patient supine. No labial fusion. There is no rash, tenderness, lesion or injury on the right labia. There is no rash, tenderness, lesion or injury on the left labia. Cervix exhibits no motion tenderness, no discharge and no friability. Right adnexum displays no mass, no tenderness and no fullness. Left adnexum displays no mass, no tenderness and no fullness. No tenderness or bleeding in the vagina. No vaginal discharge found.  Musculoskeletal: Normal range of motion.  Lymphadenopathy:    She has no cervical adenopathy.       Right: No inguinal adenopathy present.       Left: No inguinal adenopathy present.  Neurological: She is alert and oriented to person, place, and time. She has normal reflexes.  Skin: Skin is warm and dry.  Psychiatric: She has a normal mood and affect. Her behavior is normal. Judgment and thought content normal.  Vitals reviewed.      Assessment & Plan:   Problem List Items Addressed This Visit      Other   Iron deficiency anemia Stable and well managed according to patient report.  Plan: 1. Assess CBC and Ferritin to evaluate. Consider regular ferrous sulfate dosing. 2. Reinforced education regarding dietary intake of iron.   Relevant Orders   CBC with Differential/Platelet   Ferritin    Other Visit Diagnoses    Encounter for annual physical exam    -  Primary Well-appearing female. Stable BMI. Plan: 1. Screening Pap smear with HPV today. 2. Screening TSH. 3. Screening CBC for possible anemia. 4. Cholesterol and other labs measured at employee wellness and [facility]. 4.  Follow up in 1 year for annual physical.   Relevant Medications   norethindrone-ethinyl estradiol 1/35 (ALAYCEN 1/35) tablet   Other Relevant Orders   CBC with Differential/Platelet   TSH   Ferritin   PAP, Thin Prep w/HPV rflx HPV Type 16/18   Encounter for surveillance of contraceptive pills     Patient with regular use of OCP without missed doses.  Patient previously encouraged to transition from OCP.    Plan: 1. Discused LARC options. Printed information given for IUD and implanon if patient wants to transition from OCP.  Discussed hormonal nature of these options with exception of copper IUD.  Discussed risks of hormone contraception.  Patient at lower risk with non-smoker, normal cholesterol, and age less than 65.   Patient may consider copper IUD, but okay with risks/benefits of OCP for now.  Contact clinic  if needs GYN referral. Possible for self-referral.   2.Continue OCP for now.  Refill sent for norethindrone-ethinyl estradiol 1/35.    Relevant Medications   norethindrone-ethinyl estradiol 1/35 (ALAYCEN 1/35) tablet      Meds ordered this encounter  Medications  . norethindrone-ethinyl estradiol 1/35 (ALAYCEN 1/35) tablet    Sig: Take 1 tablet by mouth daily.    Dispense:  1 Package    Refill:  11    Order Specific Question:   Supervising Provider    Answer:   Olin Hauser [[id number]]     Follow up plan: Return in about 1 year (around 10/07/2017) for annual physical.  Cassell Smiles, DNP, AGPCNP-BC Adult Gerontology Primary Care Nurse Practitioner [facility] 10/07/2016, 10:59 AM

## 2016-10-08 LAB — FERRITIN: Ferritin: 23 ng/mL (ref 10–232)

## 2016-10-08 LAB — PAP, THIN PREP W/HPV RFLX HPV TYPE 16/18: HPV DNA High Risk: NOT DETECTED

## 2016-10-08 LAB — TSH: TSH: 1.01 mIU/L

## 2016-11-04 DIAGNOSIS — H524 Presbyopia: Secondary | ICD-10-CM | POA: Diagnosis not present

## 2017-03-04 LAB — LIPID PANEL
Cholesterol: 211 — AB (ref 0–200)
HDL: 103 — AB (ref 35–70)
LDL CALC: 83
TRIGLYCERIDES: 124 (ref 40–160)

## 2017-03-04 LAB — BASIC METABOLIC PANEL
BUN: 8 (ref 4–21)
Creatinine: 1.1 (ref 0.5–1.1)
Glucose: 98

## 2017-03-04 LAB — HEPATIC FUNCTION PANEL
ALK PHOS: 47 (ref 25–125)
ALT: 22 (ref 7–35)
AST: 22 (ref 13–35)
BILIRUBIN, TOTAL: 0.9

## 2017-03-12 ENCOUNTER — Encounter: Payer: Self-pay | Admitting: Family Medicine

## 2017-04-12 ENCOUNTER — Encounter: Payer: Self-pay | Admitting: Family Medicine

## 2017-10-07 ENCOUNTER — Encounter: Payer: Self-pay | Admitting: Nurse Practitioner

## 2017-10-20 ENCOUNTER — Encounter: Payer: Self-pay | Admitting: Nurse Practitioner

## 2017-10-23 ENCOUNTER — Other Ambulatory Visit: Payer: Self-pay | Admitting: Nurse Practitioner

## 2017-10-23 DIAGNOSIS — Z3041 Encounter for surveillance of contraceptive pills: Secondary | ICD-10-CM

## 2017-10-23 DIAGNOSIS — Z0000 Encounter for general adult medical examination without abnormal findings: Secondary | ICD-10-CM

## 2017-11-30 ENCOUNTER — Encounter: Payer: Self-pay | Admitting: Nurse Practitioner

## 2017-12-31 ENCOUNTER — Encounter: Payer: Self-pay | Admitting: Nurse Practitioner

## 2017-12-31 ENCOUNTER — Ambulatory Visit (INDEPENDENT_AMBULATORY_CARE_PROVIDER_SITE_OTHER): Payer: BLUE CROSS/BLUE SHIELD | Admitting: Nurse Practitioner

## 2017-12-31 VITALS — BP 154/92 | HR 69 | Temp 98.6°F | Ht 64.0 in | Wt 170.0 lb

## 2017-12-31 DIAGNOSIS — Z0001 Encounter for general adult medical examination with abnormal findings: Secondary | ICD-10-CM

## 2017-12-31 DIAGNOSIS — D508 Other iron deficiency anemias: Secondary | ICD-10-CM

## 2017-12-31 DIAGNOSIS — Z1329 Encounter for screening for other suspected endocrine disorder: Secondary | ICD-10-CM | POA: Diagnosis not present

## 2017-12-31 DIAGNOSIS — Z3041 Encounter for surveillance of contraceptive pills: Secondary | ICD-10-CM

## 2017-12-31 DIAGNOSIS — Z0000 Encounter for general adult medical examination without abnormal findings: Secondary | ICD-10-CM | POA: Diagnosis not present

## 2017-12-31 MED ORDER — NORETHINDRONE-ETH ESTRADIOL 1-35 MG-MCG PO TABS: 1.0000 | ORAL_TABLET | Freq: Every day | ORAL | 4 refills | Status: DC

## 2017-12-31 NOTE — Progress Notes (Signed)
Subjective:    Patient ID: Madison Conway, female    DOB: 08-Jun-1971, 47 y.o.   MRN: [id number]  Madison Conway is a 47 y.o. female presenting on 12/31/2017 for Annual Exam   HPI Annual Physical Exam Patient has been feeling well.  They have no acute concerns today. Sleeps 7 hours per night usually uninterrupted.  She requests refill of OCP today.  Reports regular menses with hormone free days, no breakthrough bleeding, no missed periods.  Takes pill daily.  HEALTH MAINTENANCE:   Weight/BMI: increased x 3 lbs Physical activity: regularly 10,000 steps Diet: generally healthy, potato chips are weakness Seatbelt: always Sunscreen: regular PAP: declined for this year - up to date Mammogram: Has been getting free mammo at her employer - in Fall - patient to call if not offered Optometry: 2018 Dentistry: regular  VACCINES: Tetanus: up to date   Past Medical History:  Diagnosis Date  . Asthma    childhood.   Past Surgical History:  Procedure Laterality Date  . NO PAST SURGERIES     Social History   Socioeconomic History  . Marital status: Single    Spouse name: Not on file  . Number of children: Not on file  . Years of education: Not on file  . Highest education level: Not on file  Occupational History  . Not on file  Social Needs  . Financial resource strain: Not on file  . Food insecurity:    Worry: Not on file    Inability: Not on file  . Transportation needs:    Medical: Not on file    Non-medical: Not on file  Tobacco Use  . Smoking status: Never Smoker  . Smokeless tobacco: Never Used  Substance and Sexual Activity  . Alcohol use: Yes    Alcohol/week: 1.2 - 1.8 oz    Types: 2 - 3 Glasses of wine per week  . Drug use: No  . Sexual activity: Yes    Birth control/protection: Pill  Lifestyle  . Physical activity:    Days per week: Not on file    Minutes per session: Not on file  . Stress: Not on file  Relationships  . Social connections:   Talks on phone: Not on file    Gets together: Not on file    Attends religious service: Not on file    Active member of club or organization: Not on file    Attends meetings of clubs or organizations: Not on file    Relationship status: Not on file  . Intimate partner violence:    Fear of current or ex partner: Not on file    Emotionally abused: Not on file    Physically abused: Not on file    Forced sexual activity: Not on file  Other Topics Concern  . Not on file  Social History Narrative  . Not on file   Family History  Problem Relation Age of Onset  . Diabetes Maternal Grandmother   . Breast cancer Neg Hx   . Colon cancer Neg Hx   . Ovarian cancer Neg Hx    No current outpatient medications on file prior to visit.   No current facility-administered medications on file prior to visit.     Review of Systems  Constitutional: Negative for chills and fever.  HENT: Negative for congestion and sore throat.   Eyes: Negative for pain.  Respiratory: Negative for cough, shortness of breath and wheezing.   Cardiovascular: Negative for chest pain,  palpitations and leg swelling.  Gastrointestinal: Negative for abdominal pain, blood in stool, constipation, diarrhea, nausea and vomiting.  Endocrine: Negative for polydipsia.  Genitourinary: Negative for dysuria, frequency, hematuria and urgency.  Musculoskeletal: Negative for back pain, myalgias and neck pain.  Skin: Negative.  Negative for rash.  Allergic/Immunologic: Negative for environmental allergies.  Neurological: Negative for dizziness, weakness and headaches.  Hematological: Does not bruise/bleed easily.  Psychiatric/Behavioral: Negative for dysphoric mood and suicidal ideas. The patient is not nervous/anxious.    Per HPI unless specifically indicated above     Objective:    BP (!) 154/92 (BP Location: Left Arm, Patient Position: Sitting)   Pulse 69   Temp 98.6 F (37 C) (Oral)   Ht 5\' 4"  (1.626 m)   Wt 170 lb (77.1  kg)   LMP 12/08/2017 (Approximate)   BMI 29.18 kg/m   Wt Readings from Last 3 Encounters:  12/31/17 170 lb (77.1 kg)  10/07/16 167 lb (75.8 kg)  07/10/16 167 lb (75.8 kg)    Physical Exam  Constitutional: She is oriented to person, place, and time. She appears well-developed and well-nourished. No distress.  HENT:  Head: Normocephalic and atraumatic.  Right Ear: External ear normal.  Left Ear: External ear normal.  Nose: Nose normal.  Mouth/Throat: Oropharynx is clear and moist.  Eyes: Pupils are equal, round, and reactive to light. Conjunctivae are normal.  Neck: Normal range of motion. Neck supple. No JVD present. No tracheal deviation present. No thyromegaly present.  Cardiovascular: Normal rate, regular rhythm, S1 normal, S2 normal, normal heart sounds and intact distal pulses. Exam reveals no gallop and no friction rub.  No murmur heard. Pulmonary/Chest: Effort normal and breath sounds normal. No respiratory distress.  Breast - Normal exam w/ symmetric breasts, no mass, no nipple discharge, no skin changes or tenderness.   Abdominal: Soft. Bowel sounds are normal. She exhibits no distension. There is no hepatosplenomegaly. There is no tenderness.  Musculoskeletal: Normal range of motion. She exhibits no edema (pedal).  Lymphadenopathy:    She has no cervical adenopathy.  Neurological: She is alert and oriented to person, place, and time. No cranial nerve deficit.  Skin: Skin is warm and dry.  Psychiatric: She has a normal mood and affect. Her behavior is normal. Judgment and thought content normal.  Nursing note and vitals reviewed.    Results for orders placed or performed in visit on 07/37/10  Basic metabolic panel  Result Value Ref Range   Glucose 98    BUN 8 4 - 21   Creatinine 1.1 0.5 - 1.1  Lipid panel  Result Value Ref Range   Triglycerides 124 40 - 160   Cholesterol 211 (A) 0 - 200   HDL 103 (A) 35 - 70   LDL Cholesterol 83   Hepatic function panel  Result  Value Ref Range   Alkaline Phosphatase 47 25 - 125   ALT 22 7 - 35   AST 22 13 - 35   Bilirubin, Total 0.9       Assessment & Plan:   Problem List Items Addressed This Visit      Other   Iron deficiency anemia    Other Visit Diagnoses    Encounter for general adult medical examination with abnormal findings    -  Primary   Relevant Medications   norethindrone-ethinyl estradiol 1/35 (ALAYCEN 1/35) tablet   Other Relevant Orders   CBC with Differential/Platelet   TSH   Encounter for surveillance of contraceptive  pills       Relevant Medications   norethindrone-ethinyl estradiol 1/35 (ALAYCEN 1/35) tablet      Physical exam with findings of elevated BP reading.  Well adult with no acute concerns.  OCP effective and continues to be preferred contraception option  Plan: 1. Obtain health maintenance screenings as above according to age. - CBC for anemia monitoring - Workplace labs will be collected for other lab screenings - Mammogram in fall 2019 for breast cancer screening - Increase physical activity to 30 minutes most days of the week.  - Eat healthy diet high in vegetables and fruits; low in refined carbohydrates.  Reduce salt in diet, substitute healthy snacks for chips and limit chip serving sizes. - Refill provided contraception.  Continue current OCP. 2. Return 1 year for annual physical and in 2 weeks for BP check with CMA.   Meds ordered this encounter  Medications  . norethindrone-ethinyl estradiol 1/35 (ALAYCEN 1/35) tablet    Sig: Take 1 tablet by mouth daily.    Dispense:  3 Package    Refill:  4    Order Specific Question:   Supervising Provider    Answer:   Madison Conway [[id number]]     Follow up plan: Return in about 1 year (around 01/01/2019) for annual physical AND in 2 weeks for BP check with CMA.  Madison Smiles, DNP, AGPCNP-BC Adult Gerontology Primary Care Nurse Practitioner [facility] 12/31/2017, 12:09 PM

## 2017-12-31 NOTE — Patient Instructions (Addendum)
Madison Conway,   Thank you for coming in to clinic today.  1. Work to have a healthier snack substitute for chips at least 2 x per week.  When snacking, portion your serving size to prevent from eating 2-3 servings in one sitting.  2. Continue all other healthy eating habits.  3. Lab screenings with mammogram with your employer in September. - We are monitoring for anemia today.  4. May continue taking famotidine instead of brand name Pepcid.  Please schedule a follow-up appointment with Cassell Smiles, AGNP. Return in about 1 year (around 01/01/2019) for annual physical.  If you have any other questions or concerns, please feel free to call the clinic or send a message through [place]. You may also schedule an earlier appointment if necessary.  You will receive a survey after today's visit either digitally by e-mail or paper by [employer]. Your experiences and feedback matter to [place].  Please respond so we know how we are doing as we provide care for you.   Cassell Smiles, DNP, AGNP-BC Adult Gerontology Nurse Practitioner [place]

## 2018-01-01 ENCOUNTER — Telehealth: Payer: Self-pay | Admitting: Nurse Practitioner

## 2018-01-01 DIAGNOSIS — Z1211 Encounter for screening for malignant neoplasm of colon: Secondary | ICD-10-CM

## 2018-01-01 DIAGNOSIS — Z1231 Encounter for screening mammogram for malignant neoplasm of breast: Secondary | ICD-10-CM

## 2018-01-01 LAB — CBC WITH DIFFERENTIAL/PLATELET
Basophils Absolute: 51 cells/uL (ref 0–200)
Basophils Relative: 0.6 %
Eosinophils Absolute: 306 cells/uL (ref 15–500)
Eosinophils Relative: 3.6 %
HCT: 35.8 % (ref 35.0–45.0)
Hemoglobin: 11.4 g/dL — ABNORMAL LOW (ref 11.7–15.5)
Lymphs Abs: 1955 cells/uL (ref 850–3900)
MCH: 27.4 pg (ref 27.0–33.0)
MCHC: 31.8 g/dL — ABNORMAL LOW (ref 32.0–36.0)
MCV: 86.1 fL (ref 80.0–100.0)
MPV: 8.3 fL (ref 7.5–12.5)
Monocytes Relative: 6.4 %
Neutro Abs: 5644 cells/uL (ref 1500–7800)
Neutrophils Relative %: 66.4 %
Platelets: 409 10*3/uL — ABNORMAL HIGH (ref 140–400)
RBC: 4.16 10*6/uL (ref 3.80–5.10)
RDW: 14.4 % (ref 11.0–15.0)
Total Lymphocyte: 23 %
WBC mixed population: 544 cells/uL (ref 200–950)
WBC: 8.5 10*3/uL (ref 3.8–10.8)

## 2018-01-01 LAB — TSH: TSH: 0.93 mIU/L

## 2018-01-01 NOTE — Telephone Encounter (Signed)
Pt asked for an order for a mammo so she can schedule with [place] and also wants a referral  for colonoscopy.  Her call back number is [phone]

## 2018-01-01 NOTE — Telephone Encounter (Signed)
Patient had annual physical on 12/31/2017.  Was instructed to consider colonoscopy for colon cancer screening starting at age 45 or defer to age 46.  Patient was asked to verify insurance coverage for preventative screening colonoscopy at age 37 is compared to age 68.  Patient returns call to proceed with colonoscopy.  Mammogram discussed 12/31/2017 and order placed.

## 2018-01-07 ENCOUNTER — Encounter: Payer: Self-pay | Admitting: Nurse Practitioner

## 2018-01-07 NOTE — Telephone Encounter (Signed)
Mychart message

## 2018-01-14 ENCOUNTER — Ambulatory Visit: Payer: BLUE CROSS/BLUE SHIELD

## 2018-01-14 VITALS — BP 182/101 | HR 72

## 2018-01-14 DIAGNOSIS — I10 Essential (primary) hypertension: Secondary | ICD-10-CM

## 2018-01-14 MED ORDER — AMLODIPINE BESYLATE 5 MG PO TABS: 5.0000 mg | ORAL_TABLET | Freq: Every day | ORAL | 1 refills | Status: DC

## 2018-01-14 NOTE — Progress Notes (Signed)
Start amlodipine 5 mg once daily.  Recheck with BP visit in 6 weeks.  Patient called, discussed side effects.  She will call clinic tomorrow to schedule her followup appointment.

## 2018-01-15 ENCOUNTER — Encounter: Payer: Self-pay | Admitting: Nurse Practitioner

## 2018-01-21 DIAGNOSIS — H524 Presbyopia: Secondary | ICD-10-CM | POA: Diagnosis not present

## 2018-01-23 ENCOUNTER — Ambulatory Visit
Admission: RE | Admit: 2018-01-23 | Discharge: 2018-01-23 | Disposition: A | Discharge status: Home / Self Care | Payer: BLUE CROSS/BLUE SHIELD | Source: Ambulatory Visit | Attending: Nurse Practitioner | Admitting: Nurse Practitioner

## 2018-01-23 DIAGNOSIS — Z1231 Encounter for screening mammogram for malignant neoplasm of breast: Secondary | ICD-10-CM | POA: Diagnosis not present

## 2018-02-10 ENCOUNTER — Encounter: Payer: Self-pay | Admitting: Nurse Practitioner

## 2018-02-10 DIAGNOSIS — I10 Essential (primary) hypertension: Secondary | ICD-10-CM

## 2018-02-10 MED ORDER — AMLODIPINE BESYLATE 5 MG PO TABS: 5.0000 mg | ORAL_TABLET | Freq: Every day | ORAL | 0 refills | Status: DC

## 2018-02-12 ENCOUNTER — Encounter: Payer: Self-pay | Admitting: Nurse Practitioner

## 2018-03-04 ENCOUNTER — Encounter: Payer: Self-pay | Admitting: Nurse Practitioner

## 2018-03-04 DIAGNOSIS — I10 Essential (primary) hypertension: Secondary | ICD-10-CM

## 2018-03-04 MED ORDER — AMLODIPINE BESYLATE 10 MG PO TABS: 10.0000 mg | ORAL_TABLET | Freq: Every day | ORAL | 0 refills | Status: DC

## 2018-03-04 NOTE — Telephone Encounter (Signed)
.  mychart

## 2018-03-04 NOTE — Telephone Encounter (Signed)
Replied via other MyChart message

## 2018-03-20 ENCOUNTER — Other Ambulatory Visit: Payer: Self-pay

## 2018-03-20 ENCOUNTER — Ambulatory Visit (INDEPENDENT_AMBULATORY_CARE_PROVIDER_SITE_OTHER): Payer: BLUE CROSS/BLUE SHIELD | Admitting: Nurse Practitioner

## 2018-03-20 ENCOUNTER — Encounter: Payer: Self-pay | Admitting: Nurse Practitioner

## 2018-03-20 VITALS — BP 132/89 | HR 87 | Temp 98.5°F | Ht 64.0 in | Wt 166.2 lb

## 2018-03-20 DIAGNOSIS — I10 Essential (primary) hypertension: Secondary | ICD-10-CM | POA: Diagnosis not present

## 2018-03-20 LAB — COMPLETE METABOLIC PANEL WITH GFR
AG Ratio: 1.2 (calc) (ref 1.0–2.5)
ALT: 18 U/L (ref 6–29)
AST: 18 U/L (ref 10–35)
Albumin: 3.8 g/dL (ref 3.6–5.1)
Alkaline phosphatase (APISO): 46 U/L (ref 33–115)
BUN: 11 mg/dL (ref 7–25)
CO2: 24 mmol/L (ref 20–32)
Calcium: 9.1 mg/dL (ref 8.6–10.2)
Chloride: 103 mmol/L (ref 98–110)
Creat: 0.97 mg/dL (ref 0.50–1.10)
GFR, Est African American: 82 mL/min/{1.73_m2} (ref >=60)
GFR, Est Non African American: 71 mL/min/{1.73_m2} (ref >=60)
Globulin: 3.3 g/dL (calc) (ref 1.9–3.7)
Glucose, Bld: 77 mg/dL (ref 65–99)
Potassium: 4.5 mmol/L (ref 3.5–5.3)
Sodium: 136 mmol/L (ref 135–146)
Total Bilirubin: 0.6 mg/dL (ref 0.2–1.2)
Total Protein: 7.1 g/dL (ref 6.1–8.1)

## 2018-03-20 MED ORDER — HYDROCHLOROTHIAZIDE 12.5 MG PO TABS: 12.5000 mg | ORAL_TABLET | Freq: Every day | ORAL | 5 refills | Status: DC

## 2018-03-20 NOTE — Progress Notes (Signed)
Subjective:    Patient ID: Madison Conway, female    DOB: 1971-05-07, 47 y.o.   MRN: [id number]  Madison Conway is a 47 y.o. female presenting on 03/20/2018 for Hypertension   HPI Hypertension - She is checking BP at home or outside of clinic.  Readings sent via Mychart and are 115-130/70-80s - Current medications: amlodipine 10 mg once daily,  tolerating well without side effects - She is not currently symptomatic. - Pt denies headache, lightheadedness, dizziness, changes in vision, chest tightness/pressure, palpitations, leg swelling, sudden loss of speech or loss of consciousness. - She  reports no regular exercise routine. - Her diet is moderate in salt, moderate in fat, and moderate in carbohydrates.   Social History   Tobacco Use  . Smoking status: Never Smoker  . Smokeless tobacco: Never Used  Substance Use Topics  . Alcohol use: Yes    Alcohol/week: 2.0 - 3.0 standard drinks    Types: 2 - 3 Glasses of wine per week  . Drug use: No    Review of Systems Per HPI unless specifically indicated above     Objective:    BP 132/89 (BP Location: Left Arm, Patient Position: Sitting, Cuff Size: Normal)   Pulse 87   Temp 98.5 F (36.9 C) (Oral)   Ht 5\' 4"  (1.626 m)   Wt 166 lb 3.2 oz (75.4 kg)   BMI 28.53 kg/m   Wt Readings from Last 3 Encounters:  03/20/18 166 lb 3.2 oz (75.4 kg)  12/31/17 170 lb (77.1 kg)  10/07/16 167 lb (75.8 kg)    Physical Exam  Constitutional: She is oriented to person, place, and time. She appears well-developed and well-nourished. No distress.  HENT:  Head: Normocephalic and atraumatic.  Cardiovascular: Normal rate, regular rhythm, S1 normal, S2 normal, normal heart sounds and intact distal pulses.  Pulmonary/Chest: Effort normal and breath sounds normal. No respiratory distress.  Neurological: She is alert and oriented to person, place, and time.  Skin: Skin is warm and dry.  Psychiatric: She has a normal mood and affect. Her  behavior is normal.  Vitals reviewed.   Results for orders placed or performed in visit on 12/31/17  TSH  Result Value Ref Range   TSH 0.93 mIU/L  CBC with Differential/Platelet  Result Value Ref Range   WBC 8.5 3.8 - 10.8 Thousand/uL   RBC 4.16 3.80 - 5.10 Million/uL   Hemoglobin 11.4 (L) 11.7 - 15.5 g/dL   HCT 35.8 35.0 - 45.0 %   MCV 86.1 80.0 - 100.0 fL   MCH 27.4 27.0 - 33.0 pg   MCHC 31.8 (L) 32.0 - 36.0 g/dL   RDW 14.4 11.0 - 15.0 %   Platelets 409 (H) 140 - 400 Thousand/uL   MPV 8.3 7.5 - 12.5 fL   Neutro Abs 5,644 1,500 - 7,800 cells/uL   Lymphs Abs 1,955 850 - 3,900 cells/uL   WBC mixed population 544 200 - 950 cells/uL   Eosinophils Absolute 306 15 - 500 cells/uL   Basophils Absolute 51 0 - 200 cells/uL   Neutrophils Relative % 66.4 %   Total Lymphocyte 23.0 %   Monocytes Relative 6.4 %   Eosinophils Relative 3.6 %   Basophils Relative 0.6 %      Assessment & Plan:   Problem List Items Addressed This Visit    None    Visit Diagnoses    Essential hypertension    -  Primary   Relevant Medications   hydrochlorothiazide (  HYDRODIURIL) 12.5 MG tablet   Other Relevant Orders   COMPLETE METABOLIC PANEL WITH GFR (Completed)    Improved hypertension, but remains above BP goal < 130/80.  Pt is working on lifestyle modifications.  Taking medications tolerating well without side effects. Complicated by stress.  Plan: 1. Continue taking amlodipine 10 mg once daily - START hydrochlorothiazide 12.5 mg once daily 2. Obtain labs   3. Encouraged heart healthy diet and increasing exercise to 30 minutes most days of the week. 4. Check BP 1-2 x per week at home, keep log, and bring to clinic at next appointment. 5. Follow up 3 months.     Meds ordered this encounter  Medications  . hydrochlorothiazide (HYDRODIURIL) 12.5 MG tablet    Sig: Take 1 tablet (12.5 mg total) by mouth daily.    Dispense:  30 tablet    Refill:  5    Order Specific Question:   Supervising  Provider    Answer:   Olin Hauser [[id number]]    Follow up plan: Return in about 3 months (around 06/19/2018) for hypertension.  Cassell Smiles, DNP, AGPCNP-BC Adult Gerontology Primary Care Nurse Practitioner [facility] 03/20/2018, 8:48 AM

## 2018-03-20 NOTE — Patient Instructions (Addendum)
Madison Conway,   Thank you for coming in to clinic today.  1. START hydrochlorothiazide 12.5 mg once daily.  2. Continue amlodipine 10 mg once daily.  3. Continue all of your work on lifestyle!  You are doing great!  4. BP goals < 130/80  AND greater than 110/65 on medications.  Truly low BP is < 90-95/60.  Please schedule a follow-up appointment with Cassell Smiles, AGNP. Return in about 3 months (around 06/19/2018) for hypertension.  If you have any other questions or concerns, please feel free to call the clinic or send a message through [place]. You may also schedule an earlier appointment if necessary.  You will receive a survey after today's visit either digitally by e-mail or paper by [employer]. Your experiences and feedback matter to [place].  Please respond so we know how we are doing as we provide care for you.   Cassell Smiles, DNP, AGNP-BC Adult Gerontology Nurse Practitioner [place]

## 2018-03-24 ENCOUNTER — Encounter: Payer: Self-pay | Admitting: Nurse Practitioner

## 2018-03-27 ENCOUNTER — Other Ambulatory Visit: Payer: Self-pay | Admitting: Nurse Practitioner

## 2018-03-27 DIAGNOSIS — I10 Essential (primary) hypertension: Secondary | ICD-10-CM | POA: Diagnosis not present

## 2018-03-27 DIAGNOSIS — K8050 Calculus of bile duct without cholangitis or cholecystitis without obstruction: Secondary | ICD-10-CM | POA: Diagnosis not present

## 2018-03-27 DIAGNOSIS — K769 Liver disease, unspecified: Secondary | ICD-10-CM | POA: Diagnosis not present

## 2018-03-27 DIAGNOSIS — K8042 Calculus of bile duct with acute cholecystitis without obstruction: Secondary | ICD-10-CM | POA: Diagnosis not present

## 2018-03-27 DIAGNOSIS — R1011 Right upper quadrant pain: Secondary | ICD-10-CM | POA: Diagnosis not present

## 2018-03-27 DIAGNOSIS — R1013 Epigastric pain: Secondary | ICD-10-CM | POA: Diagnosis not present

## 2018-03-27 DIAGNOSIS — D72829 Elevated white blood cell count, unspecified: Secondary | ICD-10-CM | POA: Diagnosis not present

## 2018-03-27 DIAGNOSIS — R109 Unspecified abdominal pain: Secondary | ICD-10-CM | POA: Diagnosis not present

## 2018-03-27 DIAGNOSIS — Z7289 Other problems related to lifestyle: Secondary | ICD-10-CM | POA: Diagnosis not present

## 2018-03-27 DIAGNOSIS — K838 Other specified diseases of biliary tract: Secondary | ICD-10-CM | POA: Diagnosis not present

## 2018-03-28 DIAGNOSIS — I10 Essential (primary) hypertension: Secondary | ICD-10-CM | POA: Diagnosis not present

## 2018-03-28 DIAGNOSIS — R109 Unspecified abdominal pain: Secondary | ICD-10-CM | POA: Diagnosis not present

## 2018-03-28 DIAGNOSIS — K8042 Calculus of bile duct with acute cholecystitis without obstruction: Secondary | ICD-10-CM | POA: Diagnosis not present

## 2018-03-28 DIAGNOSIS — K838 Other specified diseases of biliary tract: Secondary | ICD-10-CM | POA: Diagnosis not present

## 2018-03-30 ENCOUNTER — Encounter: Payer: Self-pay | Admitting: Nurse Practitioner

## 2018-03-30 DIAGNOSIS — K8037 Calculus of bile duct with acute and chronic cholangitis with obstruction: Secondary | ICD-10-CM

## 2018-03-30 NOTE — Addendum Note (Signed)
Addended by: Cleaster Corin on: 03/30/2018 04:53 PM   Modules accepted: Orders

## 2018-03-31 ENCOUNTER — Encounter: Payer: Self-pay | Admitting: Nurse Practitioner

## 2018-03-31 DIAGNOSIS — K8037 Calculus of bile duct with acute and chronic cholangitis with obstruction: Secondary | ICD-10-CM

## 2018-04-13 ENCOUNTER — Encounter: Payer: Self-pay | Admitting: *Deleted

## 2018-04-13 ENCOUNTER — Encounter: Payer: Self-pay | Admitting: Nurse Practitioner

## 2018-04-13 ENCOUNTER — Ambulatory Visit: Payer: BLUE CROSS/BLUE SHIELD | Admitting: Surgery

## 2018-04-13 ENCOUNTER — Encounter: Payer: Self-pay | Admitting: Surgery

## 2018-04-13 ENCOUNTER — Other Ambulatory Visit: Payer: Self-pay

## 2018-04-13 ENCOUNTER — Other Ambulatory Visit
Admission: RE | Admit: 2018-04-13 | Discharge: 2018-04-13 | Disposition: A | Discharge status: Home / Self Care | Payer: BLUE CROSS/BLUE SHIELD | Source: Ambulatory Visit | Attending: Surgery | Admitting: Surgery

## 2018-04-13 VITALS — BP 151/94 | HR 74 | Temp 97.7°F | Ht 64.0 in | Wt 162.0 lb

## 2018-04-13 DIAGNOSIS — K8050 Calculus of bile duct without cholangitis or cholecystitis without obstruction: Secondary | ICD-10-CM | POA: Insufficient documentation

## 2018-04-13 DIAGNOSIS — K819 Cholecystitis, unspecified: Secondary | ICD-10-CM

## 2018-04-13 DIAGNOSIS — K8020 Calculus of gallbladder without cholecystitis without obstruction: Secondary | ICD-10-CM | POA: Diagnosis not present

## 2018-04-13 LAB — CBC WITH DIFFERENTIAL/PLATELET
ABS IMMATURE GRANULOCYTES: 0.05 10*3/uL (ref 0.00–0.07)
Basophils Absolute: 0.1 10*3/uL (ref 0.0–0.1)
Basophils Relative: 1 %
EOS PCT: 2 %
Eosinophils Absolute: 0.1 10*3/uL (ref 0.0–0.5)
HCT: 36.2 % (ref 36.0–46.0)
HEMOGLOBIN: 11.3 g/dL — AB (ref 12.0–15.0)
Immature Granulocytes: 1 %
LYMPHS PCT: 23 %
Lymphs Abs: 2.1 10*3/uL (ref 0.7–4.0)
MCH: 27 pg (ref 26.0–34.0)
MCHC: 31.2 g/dL (ref 30.0–36.0)
MCV: 86.4 fL (ref 80.0–100.0)
MONO ABS: 0.6 10*3/uL (ref 0.1–1.0)
MONOS PCT: 6 %
NEUTROS ABS: 6.4 10*3/uL (ref 1.7–7.7)
Neutrophils Relative %: 67 %
Platelets: 440 10*3/uL — ABNORMAL HIGH (ref 150–400)
RBC: 4.19 MIL/uL (ref 3.87–5.11)
RDW: 15.1 % (ref 11.5–15.5)
WBC: 9.3 10*3/uL (ref 4.0–10.5)
nRBC: 0 % (ref 0.0–0.2)

## 2018-04-13 LAB — COMPREHENSIVE METABOLIC PANEL
ALBUMIN: 3.7 g/dL (ref 3.5–5.0)
ALT: 20 U/L (ref 0–44)
AST: 19 U/L (ref 15–41)
Alkaline Phosphatase: 39 U/L (ref 38–126)
Anion gap: 8 (ref 5–15)
BILIRUBIN TOTAL: 1 mg/dL (ref 0.3–1.2)
BUN: 10 mg/dL (ref 6–20)
CHLORIDE: 103 mmol/L (ref 98–111)
CO2: 25 mmol/L (ref 22–32)
CREATININE: 0.95 mg/dL (ref 0.44–1.00)
Calcium: 9 mg/dL (ref 8.9–10.3)
GFR calc Af Amer: >60 mL/min (ref >60)
GLUCOSE: 99 mg/dL (ref 70–99)
POTASSIUM: 4.1 mmol/L (ref 3.5–5.1)
Sodium: 136 mmol/L (ref 135–145)
Total Protein: 7.5 g/dL (ref 6.5–8.1)

## 2018-04-13 NOTE — Patient Instructions (Addendum)
Magnetic Resonance Cholangiopancreatogram Magnetic resonance imaging (MRI) is a type of procedure that is used to produce pictures of the inside of the body without using X-rays. Instead, strong magnets and radio waves work together in a [employer], [employer] to form very detailed and sharp images. The images are viewed on a TV monitor in two-dimensional and three-dimensional form. The magnets and the radio waves are harmless. Magnetic resonance cholangiopancreatogram, or magnetic resonance cholangiopancreatography (MRCP), is an MRI that is done on your gallbladder, bile duct, pancreas, and pancreatic duct. MRCP produces detailed images of these organs. It can be used to help diagnose problems such as tumors, stones, infection, or inflammation. It is sometimes used to help determine the cause of pancreatitis or belly (abdominal) pain. Contrast material may be injected to make MRCP images even more clear. Tell a health care provider about:  Any allergies you have.  All medicines you are taking, including vitamins, herbs, eye drops, creams, and over-the-counter medicines.  Any surgeries you have had.  Medical conditions you have, including kidney disease.  Any metal you may have in your body. The magnet used in this procedure can cause metal objects in your body to move. Metal can also make it hard to get high-quality images. Objects that contain metal include: ? A pacemaker or any other implants, such as an implanted neurostimulator, a metallic ear implant, or a metallic object within the eye socket. ? Metal splinters. ? Any bullet fragments. ? A port for delivering insulin or chemotherapy.  Any tattoos you have. Some red dyes contain iron, which is sometimes a problem.  If you are pregnant or you think that you may be pregnant. It is best to avoid this test during the first 3 months of pregnancy unless there is a significant risk of missing a serious diagnosis without performing the test.  If you  are breastfeeding.  If you are afraid of cramped spaces (claustrophobic). If claustrophobia is a problem for you, it can usually be relieved with mild sedatives or antianxiety medicines. What happens before the procedure?  You will be asked to remove anything that contains metal, such as a watch or any jewelry that you are wearing. You may also be asked to remove any makeup because some makeup contains traces of metal. Braces and fillings are usually not a problem.  Women who are breastfeeding may need to pump breast milk before the exam so that they have milk to give to their baby until the contrast material (if used) has cleared from their body. What happens during the procedure?  You may be given earplugs because the machine that is used can be noisy. Headphones may also be available so you can listen to music.  If a contrast material will be used, an IV tube will be inserted into one of your veins. The contrast material will be injected through the tube.  You will lie down on a platform.  The platform will slide into a long, magnetic chamber. When you are inside the chamber, you will still be able to talk to the health care provider.  You will be asked to lie very still. The health care provider will tell you when you can shift position. You may have to wait a few minutes to make sure that the images produced during the procedure are readable. The procedure may vary among health care providers and hospitals. What happens after the procedure?  Return to your normal activities as directed by your health care provider.  If contrast  material was used, it will pass from your body within a day.  A health care provider who is experienced in MRCP will analyze the results and send a report and an interpretation of the findings to your health care provider.  It is your responsibility to obtain your test results. Ask your health care provider or the department performing the test when and how  you will get your results. This information is not intended to replace advice given to you by your health care provider. Make sure you discuss any questions you have with your health care provider. Document Released: 11/27/2007 Document Revised: 11/16/2015 Document Reviewed: 03/22/2014 Elsevier Interactive Patient Education  [employer].

## 2018-04-13 NOTE — Progress Notes (Signed)
The patient is to have the following lab work drawn today at [facility]: [place] and [facility].   Patient has been scheduled for an MRCP at [facility] for 04-29-18 at 6 pm (arrive 5:30 pm- [place] registration desk). Prep: NPO 4 hours prior. The patient is aware of date, time, and instructions.

## 2018-04-13 NOTE — Progress Notes (Signed)
Patient ID: ALISHEA BEAUDIN, female   DOB: 1970-07-24, 47 y.o.   MRN: [id number]  HPI Madison Conway is a 47 y.o. female seen in consultation at the request of Mrs. Merrilyn Puma, NP.  He had a history of intermittent abdominal pain for the last year and a half or so.  Has been intermittent located in the epigastric area and right upper quadrant.  Pain is moderate intensity.  She recently went to [facility] and she had a recurrence of her pain requiring hospitalization.  Please note that I have personally review all the records and the ultrasound.  The ultrasound showed evidence of a dilated common bile duct to 6.7 mm and suspected choledocholithiasis.  She was admitted to the hospital and her condition continued to improve.  They were going to do an MRCP but because she was doing well and it was low priority and over the weekend she decided to go home.  She now comes for follow-up.  Also personally review her laboratory data that includes a hemoglobin of 10.8 with a normal platelet count and her alkaline phosphatase and total bilirubin were normal.  She did have a slight bump on the total bilirubin that resolved.  I have also personally review previous imaging studies performed here last year in an ultrasound that showed gallbladder thickening with some sludge in the common bile duct.  HIDA scan show regular ejection fraction no evidence of cholecystitis.  She has not had any previous abdominal operations and she is able to perform more than 4 METS of activity without any shortness of breath or chest pain.  Currently she denies any abdominal pain.  No fevers no chills no evidence of biliary obstruction.  No jaundice.  No weight loss  HPI  Past Medical History:  Diagnosis Date  . Asthma    childhood.    Past Surgical History:  Procedure Laterality Date  . NO PAST SURGERIES      Family History  Problem Relation Age of Onset  . Diabetes Maternal Grandmother   . Breast cancer Neg Hx   . Colon  cancer Neg Hx   . Ovarian cancer Neg Hx     Social History Social History   Tobacco Use  . Smoking status: Never Smoker  . Smokeless tobacco: Never Used  Substance Use Topics  . Alcohol use: Yes    Alcohol/week: 2.0 - 3.0 standard drinks    Types: 2 - 3 Glasses of wine per week  . Drug use: No    No Known Allergies  Current Outpatient Medications  Medication Sig Dispense Refill  . amLODipine (NORVASC) 10 MG tablet TAKE 1 TABLET BY MOUTH EVERY DAY 90 tablet 1  . ferrous sulfate (FER-IRON) 75 (15 Fe) MG/ML SOLN Take by mouth.    . hydrochlorothiazide (HYDRODIURIL) 12.5 MG tablet Take 1 tablet (12.5 mg total) by mouth daily. 30 tablet 5  . Multiple Vitamins-Minerals (MULTIVITAMIN ADULT PO) Take 1 tablet by mouth.    . norethindrone-ethinyl estradiol 1/35 (ALAYCEN 1/35) tablet Take 1 tablet by mouth daily. 3 Package 4   No current facility-administered medications for this visit.      Review of Systems Full ROS  was asked and was negative except for the information on the HPI  Physical Exam Blood pressure (!) 151/94, pulse 74, temperature 97.7 F (36.5 C), temperature source Skin, height 5\' 4"  (1.626 m), weight 162 lb (73.5 kg), last menstrual period 03/30/2018. CONSTITUTIONAL: NAD EYES: Pupils are equal, round, and reactive to light,  Sclera are non-icteric. EARS, NOSE, MOUTH AND THROAT: The oropharynx is clear. The oral mucosa is pink and moist. Hearing is intact to voice. LYMPH NODES:  Lymph nodes in the neck are normal. RESPIRATORY:  Lungs are clear. There is normal respiratory effort, with equal breath sounds bilaterally, and without pathologic use of accessory muscles. CARDIOVASCULAR: Heart is regular without murmurs, gallops, or rubs. GI: The abdomen is  soft, nontender, and nondistended. There are no palpable masses. There is no hepatosplenomegaly. There are normal bowel sounds in all quadrants. GU: Rectal deferred.   MUSCULOSKELETAL: Normal muscle strength and tone.  No cyanosis or edema.   SKIN: Turgor is good and there are no pathologic skin lesions or ulcers. NEUROLOGIC: Motor and sensation is grossly normal. Cranial nerves are grossly intact. PSYCH:  Oriented to person, place and time. Affect is normal.  Data Reviewed  I have personally reviewed the patient's imaging, laboratory findings and medical records.    Assessment/Plan 47 year old female with history of symptomatic choledocholithiasis that clinically spontaneously resolved. Given the fact that she had evidence of choledocholithiasis on ultrasound at outside facility recently ( and one year ago)  MRCP is indicated to interrogate the CBD.  I Discussed with the patient in detail that at some point in time we will have to remove her gallbladder but the first order of business is to make sure that her common bile duct is clear and that there is no potential evidence of partial obstructions within the common bile duct caused by either stone or extrinsic compression. We will also order a repeat CBC and CMP and I will see her back after the work-up is completed.  At that time we will decide on the timing of cholecystectomy.  There is no need for any emergent surgical intervention she is not toxic or septic and not showing any signs of cholangitis. Copy of this report will be sent to the referring provider   Caroleen Hamman, MD FACS General Surgeon 04/13/2018, 10:11 AM

## 2018-04-22 NOTE — Addendum Note (Signed)
Addended by: Dominga Ferry on: 04/22/2018 08:01 AM   Modules accepted: Orders

## 2018-04-29 ENCOUNTER — Ambulatory Visit
Admission: RE | Admit: 2018-04-29 | Discharge: 2018-04-29 | Disposition: A | Discharge status: Home / Self Care | Payer: BLUE CROSS/BLUE SHIELD | Source: Ambulatory Visit | Attending: Surgery | Admitting: Surgery

## 2018-04-29 ENCOUNTER — Other Ambulatory Visit: Payer: Self-pay | Admitting: Surgery

## 2018-04-29 DIAGNOSIS — K819 Cholecystitis, unspecified: Secondary | ICD-10-CM

## 2018-04-29 DIAGNOSIS — R1011 Right upper quadrant pain: Secondary | ICD-10-CM | POA: Diagnosis not present

## 2018-04-29 DIAGNOSIS — R9389 Abnormal findings on diagnostic imaging of other specified body structures: Secondary | ICD-10-CM | POA: Diagnosis not present

## 2018-04-29 MED ORDER — GADOBUTROL 1 MMOL/ML IV SOLN
7.5000 mL | Freq: Once | INTRAVENOUS | Status: AC | PRN
  Administered 2018-04-29: 7.5 mL via INTRAVENOUS

## 2018-04-30 ENCOUNTER — Telehealth: Payer: Self-pay

## 2018-04-30 NOTE — Telephone Encounter (Signed)
[name], MD  Madison Conway, Madison Conway        Please let her know that her MRCP is normal. She will need to come back top see me so we can discuss cholecystectomy    Called patient to let her know that her MRCP was normal. Patient was told to come to her appointment that is already set-up for 05/06/2018 to discuss possible surgery. Patient agreed and had no further questions.

## 2018-05-06 ENCOUNTER — Ambulatory Visit: Payer: BLUE CROSS/BLUE SHIELD | Admitting: Surgery

## 2018-05-06 ENCOUNTER — Encounter: Payer: Self-pay | Admitting: Surgery

## 2018-05-06 ENCOUNTER — Other Ambulatory Visit: Payer: Self-pay

## 2018-05-06 VITALS — BP 122/82 | HR 72 | Temp 97.2°F | Ht 64.0 in | Wt 161.6 lb

## 2018-05-06 DIAGNOSIS — K8020 Calculus of gallbladder without cholecystitis without obstruction: Secondary | ICD-10-CM | POA: Diagnosis not present

## 2018-05-06 NOTE — Patient Instructions (Signed)
Please call our office if you decide to have your gallbladder removed.  Cholelithiasis Cholelithiasis is a form of gallbladder disease in which gallstones form in the gallbladder. The gallbladder is an organ that stores bile. Bile is made in the liver, and it helps to digest fats. Gallstones begin as small crystals and slowly grow into stones. They may cause no symptoms until the gallbladder tightens (contracts) and a gallstone is blocking the duct (gallbladder attack), which can cause pain. Cholelithiasis is also referred to as gallstones. There are two main types of gallstones:  Cholesterol stones. These are made of hardened cholesterol and are usually yellow-green in color. They are the most common type of gallstone. Cholesterol is a white, waxy, fat-like substance that is made in the liver.  Pigment stones. These are dark in color and are made of a red-yellow substance that forms when hemoglobin from red blood cells breaks down (bilirubin).  What are the causes? This condition may be caused by an imbalance in the substances that bile is made of. This can happen if the bile:  Has too much bilirubin.  Has too much cholesterol.  Does not have enough bile salts. These salts help the body absorb and digest fats.  In some cases, this condition can also be caused by the gallbladder not emptying completely or often enough. What increases the risk? The following factors may make you more likely to develop this condition:  Being female.  Having multiple pregnancies. Health care providers sometimes advise removing diseased gallbladders before future pregnancies.  Eating a diet that is heavy in fried foods, fat, and refined carbohydrates, like white bread and white rice.  Being obese.  Being older than age 80.  Prolonged use of medicines that contain female hormones (estrogen).  Having diabetes mellitus.  Rapidly losing weight.  Having a family history of gallstones.  Being of  [place] or [place] descent.  Having an intestinal disease such as Crohn disease.  Having metabolic syndrome.  Having cirrhosis.  Having severe types of anemia such as sickle cell anemia.  What are the signs or symptoms? In most cases, there are no symptoms. These are known as silent gallstones. If a gallstone blocks the bile ducts, it can cause a gallbladder attack. The main symptom of a gallbladder attack is sudden pain in the upper right abdomen. The pain usually comes at night or after eating a large meal. The pain can last for one or several hours and can spread to the right shoulder or chest. If the bile duct is blocked for more than a few hours, it can cause infection or inflammation of the gallbladder, liver, or pancreas, which may cause:  Nausea.  Vomiting.  Abdominal pain that lasts for 5 hours or more.  Fever or chills.  Yellowing of the skin or the whites of the eyes (jaundice).  Dark urine.  Light-colored stools.  How is this diagnosed? This condition may be diagnosed based on:  A physical exam.  Your medical history.  An ultrasound of your gallbladder.  CT scan.  MRI.  Blood tests to check for signs of infection or inflammation.  A scan of your gallbladder and bile ducts (biliary system) using nonharmful radioactive material and special cameras that can see the radioactive material (cholescintigram). This test checks to see how your gallbladder contracts and whether bile ducts are blocked.  Inserting a small tube with a camera on the end (endoscope) through your mouth to inspect bile ducts and check for blockages (endoscopic  retrograde cholangiopancreatogram).  How is this treated? Treatment for gallstones depends on the severity of the condition. Silent gallstones do not need treatment. If the gallstones cause a gallbladder attack or other symptoms, treatment may be required. Options for treatment include:  Surgery to remove the gallbladder  (cholecystectomy). This is the most common treatment.  Medicines to dissolve gallstones. These are most effective at treating small gallstones. You may need to take medicines for up to 6-12 months.  Shock wave treatment (extracorporeal biliary lithotripsy). In this treatment, an ultrasound machine sends shock waves to the gallbladder to break gallstones into smaller pieces. These pieces can then be passed into the intestines or be dissolved by medicine. This is rarely used.  Removing gallstones through endoscopic retrograde cholangiopancreatogram. A small basket can be attached to the endoscope and used to capture and remove gallstones.  Follow these instructions at home:  Take over-the-counter and prescription medicines only as told by your health care provider.  Maintain a healthy weight and follow a healthy diet. This includes: ? Reducing fatty foods, such as fried food. ? Reducing refined carbohydrates, like white bread and white rice. ? Increasing fiber. Aim for foods like almonds, fruit, and beans.  Keep all follow-up visits as told by your health care provider. This is important. Contact a health care provider if:  You think you have had a gallbladder attack.  You have been diagnosed with silent gallstones and you develop abdominal pain or indigestion. Get help right away if:  You have pain from a gallbladder attack that lasts for more than 2 hours.  You have abdominal pain that lasts for more than 5 hours.  You have a fever or chills.  You have persistent nausea and vomiting.  You develop jaundice.  You have dark urine or light-colored stools. Summary  Cholelithiasis (also called gallstones) is a form of gallbladder disease in which gallstones form in the gallbladder.  This condition is caused by an imbalance in the substances that make up bile. This can happen if the bile has too much cholesterol, too much bilirubin, or not enough bile salts.  You are more likely to  develop this condition if you are female, pregnant, using medicines with estrogen, obese, older than age 26, or have a family history of gallstones. You may also develop gallstones if you have diabetes, an intestinal disease, cirrhosis, or metabolic syndrome.  Treatment for gallstones depends on the severity of the condition. Silent gallstones do not need treatment.  If gallstones cause a gallbladder attack or other symptoms, treatment may be needed. The most common treatment is surgery to remove the gallbladder. This information is not intended to replace advice given to you by your health care provider. Make sure you discuss any questions you have with your health care provider. Document Released: 06/06/2005 Document Revised: 02/25/2016 Document Reviewed: 02/25/2016 Elsevier Interactive Patient Education  2018 [employer].

## 2018-05-07 ENCOUNTER — Encounter: Payer: Self-pay | Admitting: Surgery

## 2018-05-07 NOTE — Progress Notes (Signed)
Outpatient Surgical Follow Up  05/07/2018  Madison Conway is an 47 y.o. female.   Chief Complaint  Patient presents with  . New Patient (Initial Visit)    Caculus of gallbladder    HPI: Madison Conway is f/u after MRCP. I have personally reviewed it no evidence of CBD stones. SHe is doing well, no fevers, chills, no jaundice or biliary obstruction. SHe is able to perform more than 4 METS w/o SOB or C/P.   Past Medical History:  Diagnosis Date  . Asthma    childhood.    Past Surgical History:  Procedure Laterality Date  . NO PAST SURGERIES      Family History  Problem Relation Age of Onset  . Diabetes Maternal Grandmother   . Breast cancer Neg Hx   . Colon cancer Neg Hx   . Ovarian cancer Neg Hx     Social History:  reports that she has never smoked. She has never used smokeless tobacco. She reports that she drinks about 2.0 - 3.0 standard drinks of alcohol per week. She reports that she does not use drugs.  Allergies: No Known Allergies  Medications reviewed.   ROS Full ROS performed and is otherwise negative other than what is stated in HPI   BP 122/82   Pulse 72   Temp (!) 97.2 F (36.2 C) (Temporal)   Ht 5\' 4"  (1.626 m)   Wt 161 lb 9.6 oz (73.3 kg)   BMI 27.74 kg/m   Physical Exam  CONSTITUTIONAL: NAD EYES: Pupils are equal, round, and reactive to light, Sclera are non-icteric. EARS, NOSE, MOUTH AND THROAT: The oropharynx is clear. The oral mucosa is pink and moist. Hearing is intact to voice. LYMPH NODES:  Lymph nodes in the neck are normal. RESPIRATORY:  Lungs are clear. There is normal respiratory effort, with equal breath sounds bilaterally, and without pathologic use of accessory muscles. CARDIOVASCULAR: Heart is regular without murmurs, gallops, or rubs. GI: The abdomen is  soft, nontender, and nondistended. There are no palpable masses. There is no hepatosplenomegaly. There are normal bowel sounds in all quadrants. GU: Rectal deferred.    MUSCULOSKELETAL: Normal muscle strength and tone. No cyanosis or edema.   SKIN: Turgor is good and there are no pathologic skin lesions or ulcers.   Assessment/Plan:Sympt cholelithiasis with a hx resolved choledoco. D/W the pt in detail and I do recommend cholecystectomy. I do think she is a good candidate for robotic assisted chole.The risks, benefits, complications, treatment options, and expected outcomes were discussed with the patient. The possibilities of bleeding, recurrent infection, finding a normal gallbladder, perforation of viscus organs, damage to surrounding structures, bile leak, abscess formation, needing a drain placed, the need for additional procedures, reaction to medication, pulmonary aspiration,  failure to diagnose a condition, the possible need to convert to an open procedure, and creating a complication requiring transfusion or operation were discussed with the patient. The patient and/or family concurred with the proposed plan, giving informed consent. She will think about the timing that is convenient for her and let Madison Conway know. We will go ahead and place preop orders.   Madison Hamman, MD [facility] General Surgeon

## 2018-05-15 ENCOUNTER — Encounter: Payer: Self-pay | Admitting: Nurse Practitioner

## 2018-05-15 NOTE — Telephone Encounter (Signed)
Mychart message

## 2018-05-19 ENCOUNTER — Ambulatory Visit: Payer: BLUE CROSS/BLUE SHIELD | Admitting: Gastroenterology

## 2018-06-03 ENCOUNTER — Encounter: Payer: Self-pay | Admitting: Nurse Practitioner

## 2018-06-26 ENCOUNTER — Ambulatory Visit: Payer: BLUE CROSS/BLUE SHIELD | Admitting: Nurse Practitioner

## 2018-07-10 ENCOUNTER — Other Ambulatory Visit: Payer: Self-pay

## 2018-07-10 ENCOUNTER — Ambulatory Visit: Payer: BLUE CROSS/BLUE SHIELD | Admitting: Nurse Practitioner

## 2018-07-10 ENCOUNTER — Encounter: Payer: Self-pay | Admitting: Nurse Practitioner

## 2018-07-10 VITALS — BP 124/86 | HR 97 | Temp 98.5°F | Ht 64.0 in | Wt 164.8 lb

## 2018-07-10 DIAGNOSIS — I10 Essential (primary) hypertension: Secondary | ICD-10-CM | POA: Diagnosis not present

## 2018-07-10 MED ORDER — AMLODIPINE BESYLATE 10 MG PO TABS: 10.0000 mg | ORAL_TABLET | Freq: Every day | ORAL | 3 refills | Status: DC

## 2018-07-10 MED ORDER — HYDROCHLOROTHIAZIDE 12.5 MG PO TABS: 12.5000 mg | ORAL_TABLET | Freq: Every day | ORAL | 3 refills | Status: DC

## 2018-07-10 NOTE — Patient Instructions (Addendum)
Madison Conway,   Thank you for coming in to clinic today.  1. Continue current meds for blood pressure. - Keep working on healthy lifestyle! - Continue low sodium diet to about 2500mg  once daily   Please schedule a follow-up appointment with Cassell Smiles, AGNP. Return in about 7 months (around 02/08/2019) for annual physical AND 1 year for blood pressure.  If you have any other questions or concerns, please feel free to call the clinic or send a message through [facility]. You may also schedule an earlier appointment if necessary.  You will receive a survey after today's visit either digitally by e-mail or paper by [employer]. Your experiences and feedback matter to [place].  Please respond so we know how we are doing as we provide care for you.   Cassell Smiles, DNP, AGNP-BC Adult Gerontology Nurse Practitioner [place]

## 2018-07-10 NOTE — Progress Notes (Signed)
Subjective:    Patient ID: Madison Conway, female    DOB: 23-Jul-1970, 48 y.o.   MRN: [id number]  Madison Conway is a 48 y.o. female presenting on 07/10/2018 for Hypertension    HPI Hypertension - She is checking BP at home or outside of clinic.  Readings 110-125/70s - Current medications: amlodipine 10 mg once daily, HCTZ 12.5 mg once daily, tolerating well without side effects - She is not currently symptomatic. - Pt denies headache, lightheadedness, dizziness, changes in vision, chest tightness/pressure, palpitations, leg swelling, sudden loss of speech or loss of consciousness. - She  reports an exercise routine that includes walking, 4-7 days per week. - Her diet is moderate in salt, moderate in fat, and moderate in carbohydrates.  - Patient has cut back on salt significantly.  Social History   Tobacco Use  . Smoking status: Never Smoker  . Smokeless tobacco: Never Used  Substance Use Topics  . Alcohol use: Yes    Alcohol/week: 2.0 - 3.0 standard drinks    Types: 2 - 3 Glasses of wine per week  . Drug use: No    Review of Systems Per HPI unless specifically indicated above     Objective:    BP 124/86 (BP Location: Left Arm, Patient Position: Sitting, Cuff Size: Normal)   Pulse 97   Temp 98.5 F (36.9 C) (Oral)   Ht 5\' 4"  (1.626 m)   Wt 164 lb 12.8 oz (74.8 kg)   LMP 06/24/2018   BMI 28.29 kg/m   Wt Readings from Last 3 Encounters:  07/10/18 164 lb 12.8 oz (74.8 kg)  05/06/18 161 lb 9.6 oz (73.3 kg)  04/13/18 162 lb (73.5 kg)    Physical Exam Vitals signs reviewed.  Constitutional:      General: She is not in acute distress.    Appearance: She is well-developed.  HENT:     Head: Normocephalic and atraumatic.  Cardiovascular:     Rate and Rhythm: Normal rate and regular rhythm.     Pulses:          Radial pulses are 2+ on the right side and 2+ on the left side.       Posterior tibial pulses are 1+ on the right side and 1+ on the left side.   Heart sounds: Normal heart sounds, S1 normal and S2 normal.  Pulmonary:     Effort: Pulmonary effort is normal. No respiratory distress.     Breath sounds: Normal breath sounds and air entry.  Musculoskeletal:     Right lower leg: No edema.     Left lower leg: No edema.  Skin:    General: Skin is warm and dry.     Capillary Refill: Capillary refill takes less than 2 seconds.  Neurological:     Mental Status: She is alert and oriented to person, place, and time.  Psychiatric:        Attention and Perception: Attention normal.        Mood and Affect: Mood and affect normal.        Behavior: Behavior normal. Behavior is cooperative.        Thought Content: Thought content normal.        Judgment: Judgment normal.    Results for orders placed or performed during the hospital encounter of 04/13/18  Comprehensive metabolic panel  Result Value Ref Range   Sodium 136 135 - 145 mmol/L   Potassium 4.1 3.5 - 5.1 mmol/L   Chloride 103  98 - 111 mmol/L   CO2 25 22 - 32 mmol/L   Glucose, Bld 99 70 - 99 mg/dL   BUN 10 6 - 20 mg/dL   Creatinine, Ser 0.95 0.44 - 1.00 mg/dL   Calcium 9.0 8.9 - 10.3 mg/dL   Total Protein 7.5 6.5 - 8.1 g/dL   Albumin 3.7 3.5 - 5.0 g/dL   AST 19 15 - 41 U/L   ALT 20 0 - 44 U/L   Alkaline Phosphatase 39 38 - 126 U/L   Total Bilirubin 1.0 0.3 - 1.2 mg/dL   GFR calc non Af Amer >60 >60 mL/min   GFR calc Af Amer >60 >60 mL/min   Anion gap 8 5 - 15  CBC w/Diff/Platelet  Result Value Ref Range   WBC 9.3 4.0 - 10.5 K/uL   RBC 4.19 3.87 - 5.11 MIL/uL   Hemoglobin 11.3 (L) 12.0 - 15.0 g/dL   HCT 36.2 36.0 - 46.0 %   MCV 86.4 80.0 - 100.0 fL   MCH 27.0 26.0 - 34.0 pg   MCHC 31.2 30.0 - 36.0 g/dL   RDW 15.1 11.5 - 15.5 %   Platelets 440 (H) 150 - 400 K/uL   nRBC 0.0 0.0 - 0.2 %   Neutrophils Relative % 67 %   Neutro Abs 6.4 1.7 - 7.7 K/uL   Lymphocytes Relative 23 %   Lymphs Abs 2.1 0.7 - 4.0 K/uL   Monocytes Relative 6 %   Monocytes Absolute 0.6 0.1 - 1.0  K/uL   Eosinophils Relative 2 %   Eosinophils Absolute 0.1 0.0 - 0.5 K/uL   Basophils Relative 1 %   Basophils Absolute 0.1 0.0 - 0.1 K/uL   Immature Granulocytes 1 %   Abs Immature Granulocytes 0.05 0.00 - 0.07 K/uL      Assessment & Plan:   Problem List Items Addressed This Visit    None    Visit Diagnoses    Essential hypertension    -  Primary   Relevant Medications   amLODipine (NORVASC) 10 MG tablet   hydrochlorothiazide (HYDRODIURIL) 12.5 MG tablet    Controlled hypertension.  BP goal < 130/80.  Pt is working on lifestyle modifications.  Taking medications tolerating well without side effects. No Current complications.  Plan: 1. Continue taking HCTZ 12.5 mg once daily and amlodipine 10- mg once daily 2. Obtain labs next visit  3. Encouraged heart healthy diet and increasing exercise to 30 minutes most days of the week. 4. Check BP 1-2 x per week at home, keep log, and bring to clinic at next appointment. 5. Follow up at least 1 x every 12 months and at annual physicals.     Meds ordered this encounter  Medications  . amLODipine (NORVASC) 10 MG tablet    Sig: Take 1 tablet (10 mg total) by mouth daily.    Dispense:  90 tablet    Refill:  3    Order Specific Question:   Supervising Provider    Answer:   Olin Hauser [[id number]]  . hydrochlorothiazide (HYDRODIURIL) 12.5 MG tablet    Sig: Take 1 tablet (12.5 mg total) by mouth daily.    Dispense:  90 tablet    Refill:  3    Order Specific Question:   Supervising Provider    Answer:   Olin Hauser [[id number]]    Follow up plan: Return in about 7 months (around 02/08/2019) for annual physical AND 1 year for blood pressure.  Cassell Smiles,  DNP, AGPCNP-BC Adult Gerontology Primary Care Nurse Practitioner [facility] 07/10/2018, 8:56 AM

## 2018-11-08 ENCOUNTER — Encounter: Payer: Self-pay | Admitting: Nurse Practitioner

## 2018-12-26 ENCOUNTER — Encounter: Payer: Self-pay | Admitting: Nurse Practitioner

## 2018-12-28 NOTE — Telephone Encounter (Signed)
Responded to previous mychart message sent over weekend.  Nobie Putnam, [facility] 12/28/2018, 12:27 PM

## 2019-01-04 ENCOUNTER — Other Ambulatory Visit: Payer: Self-pay | Admitting: Nurse Practitioner

## 2019-01-04 DIAGNOSIS — Z0001 Encounter for general adult medical examination with abnormal findings: Secondary | ICD-10-CM

## 2019-01-04 DIAGNOSIS — Z3041 Encounter for surveillance of contraceptive pills: Secondary | ICD-10-CM

## 2019-01-08 ENCOUNTER — Encounter: Payer: BLUE CROSS/BLUE SHIELD | Admitting: Nurse Practitioner

## 2019-02-03 ENCOUNTER — Encounter: Payer: Self-pay | Admitting: Nurse Practitioner

## 2019-02-08 IMAGING — MG MM DIGITAL SCREENING BILAT W/ TOMO W/ CAD
6 of 10 series · 6 of 30 positions shown · non-contrast
Comparison: None.

CLINICAL DATA: Screening.

EXAM:
DIGITAL SCREENING BILATERAL MAMMOGRAM WITH TOMO AND CAD

[L MLO synth-2D (1 of 2)]
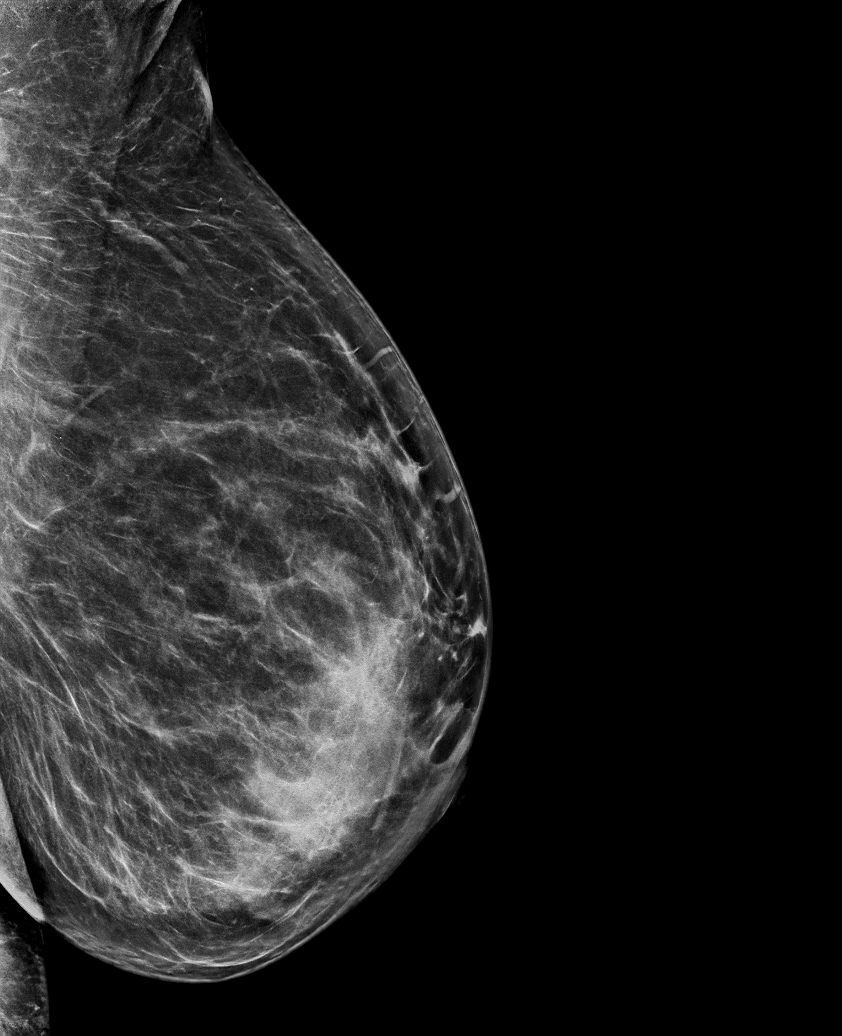

[R MLO synth-2D]
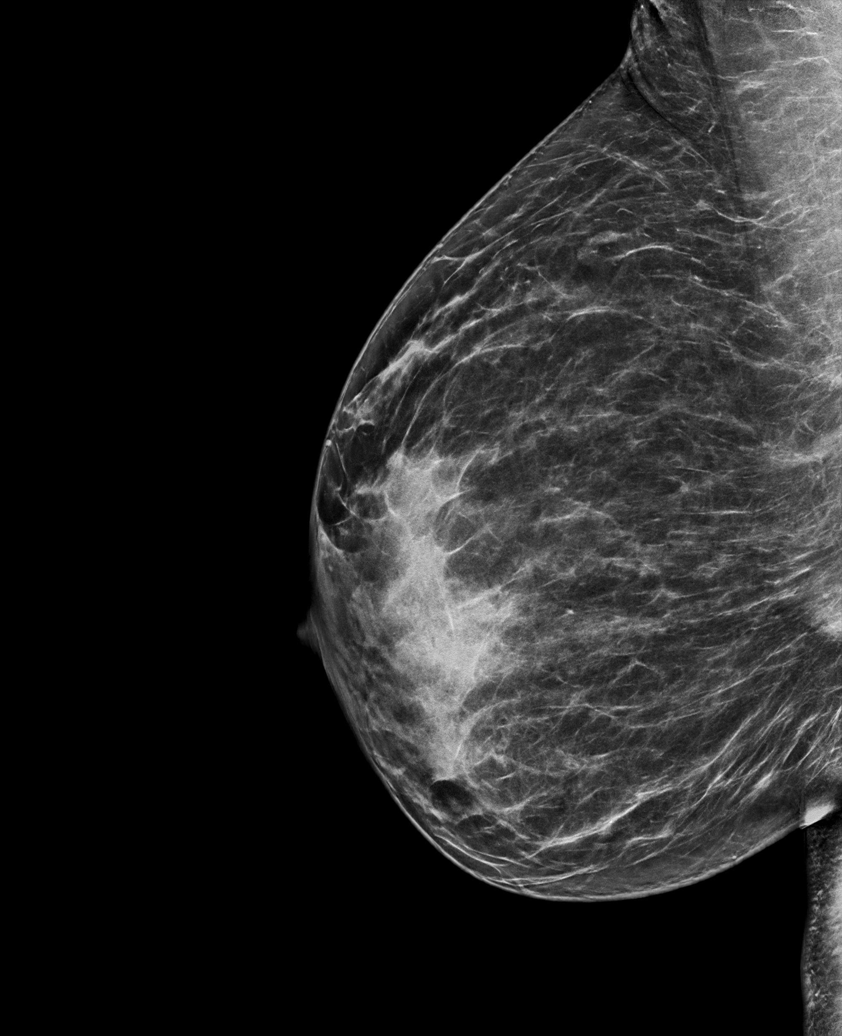

[L CC synth-2D]
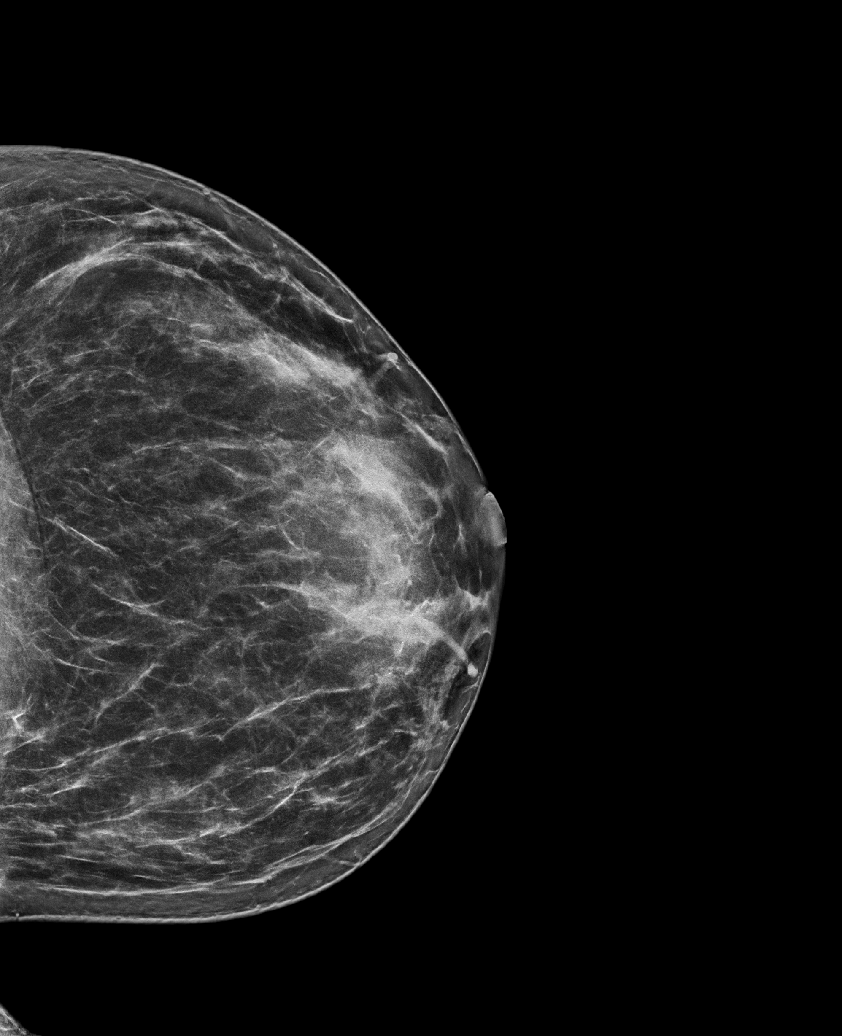

[L MLO synth-2D (2 of 2)]
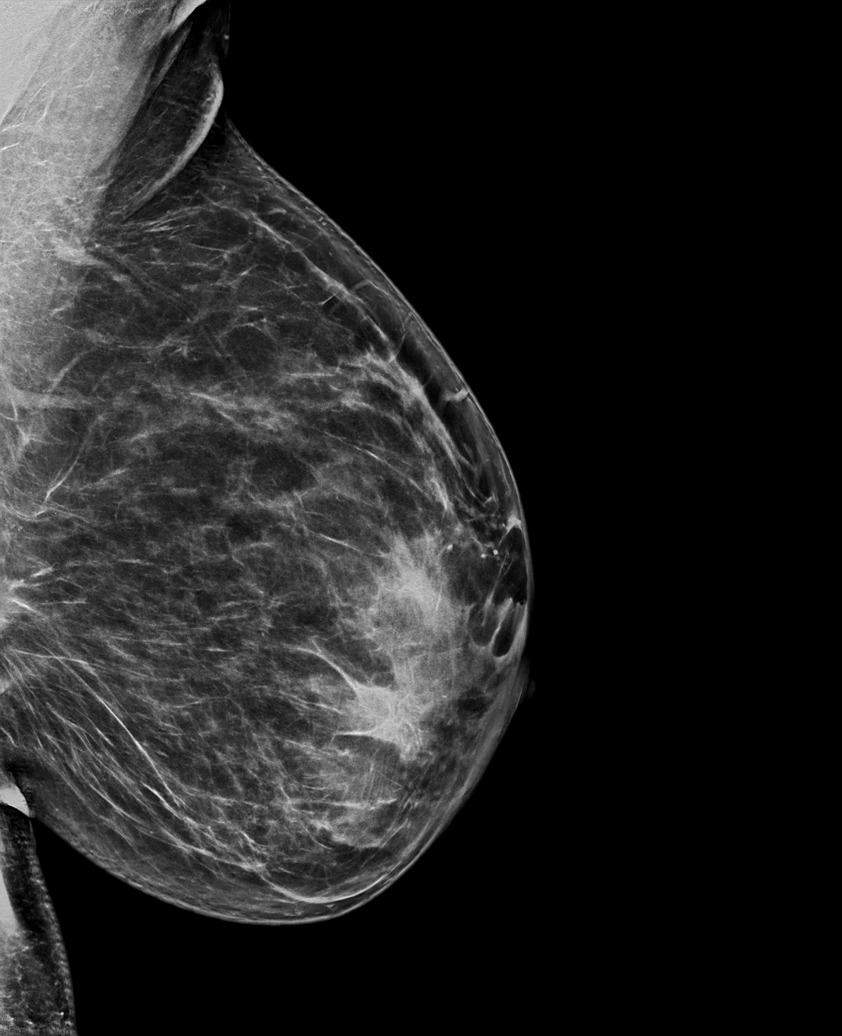

[R CC synth-2D]
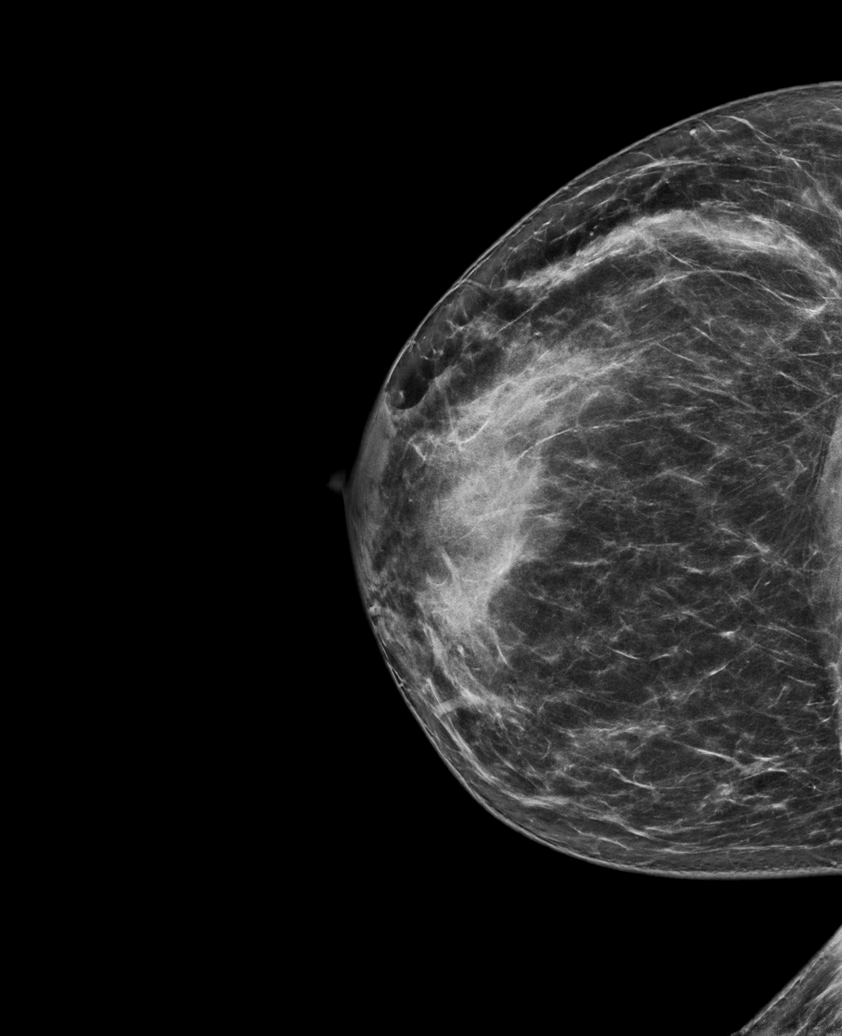

[L MLO tomo · tomo slice 42/83.0]
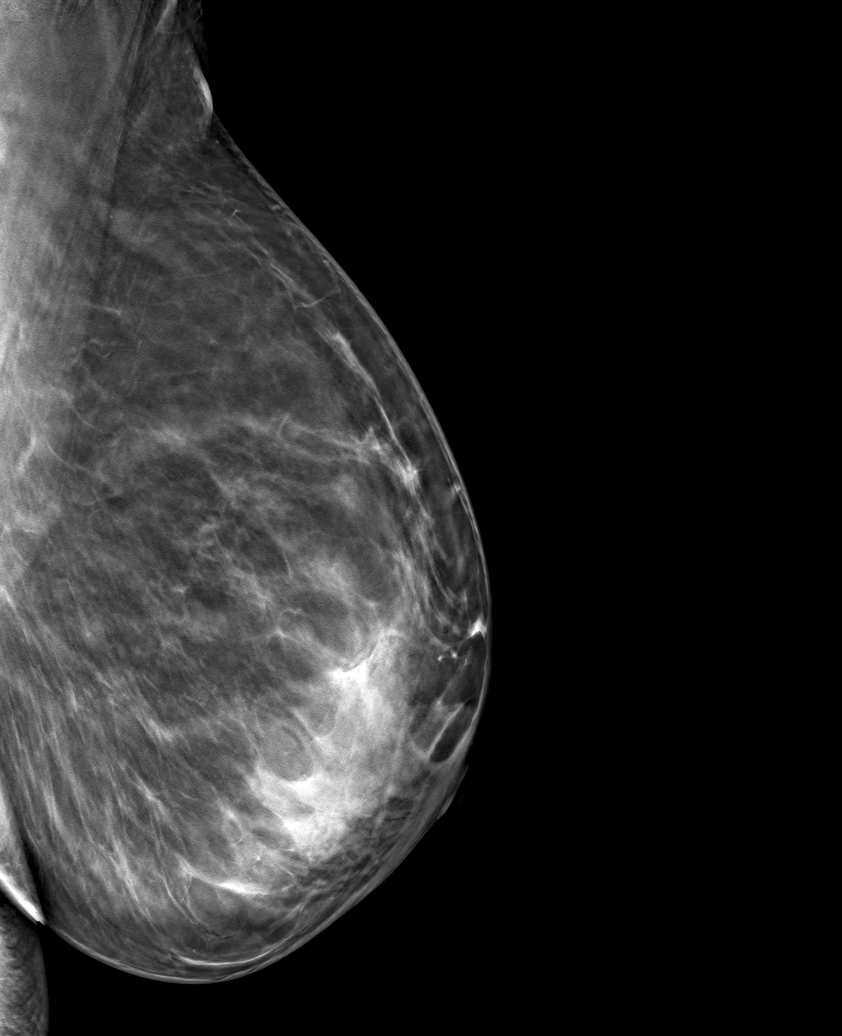

[6 of 30 positions shown; findings below may reference images not displayed]

ACR Breast Density Category c: The breast tissue is heterogeneously
dense, which may obscure small masses
FINDINGS: There are no findings suspicious for malignancy. Images were
processed with CAD.
IMPRESSION: No mammographic evidence of malignancy. A result letter of this
screening mammogram will be mailed directly to the patient.

RECOMMENDATION:
Screening mammogram in one year. (Code:EM-2-IHY)

BI-RADS CATEGORY  1: Negative.

## 2019-02-12 ENCOUNTER — Encounter: Payer: Self-pay | Admitting: Nurse Practitioner

## 2019-02-12 ENCOUNTER — Other Ambulatory Visit: Payer: Self-pay | Admitting: Nurse Practitioner

## 2019-02-12 DIAGNOSIS — Z1231 Encounter for screening mammogram for malignant neoplasm of breast: Secondary | ICD-10-CM

## 2019-02-16 ENCOUNTER — Ambulatory Visit
Admission: RE | Admit: 2019-02-16 | Discharge: 2019-02-16 | Disposition: A | Discharge status: Home / Self Care | Payer: BC Managed Care – PPO | Source: Ambulatory Visit | Attending: Nurse Practitioner | Admitting: Nurse Practitioner

## 2019-02-16 DIAGNOSIS — Z1231 Encounter for screening mammogram for malignant neoplasm of breast: Secondary | ICD-10-CM | POA: Insufficient documentation

## 2019-02-28 ENCOUNTER — Other Ambulatory Visit: Payer: Self-pay | Admitting: Family Medicine

## 2019-02-28 DIAGNOSIS — Z0001 Encounter for general adult medical examination with abnormal findings: Secondary | ICD-10-CM

## 2019-02-28 DIAGNOSIS — Z3041 Encounter for surveillance of contraceptive pills: Secondary | ICD-10-CM

## 2019-03-17 ENCOUNTER — Ambulatory Visit (INDEPENDENT_AMBULATORY_CARE_PROVIDER_SITE_OTHER): Payer: BC Managed Care – PPO | Admitting: Nurse Practitioner

## 2019-03-17 ENCOUNTER — Other Ambulatory Visit: Payer: Self-pay

## 2019-03-17 ENCOUNTER — Encounter: Payer: Self-pay | Admitting: Nurse Practitioner

## 2019-03-17 VITALS — BP 127/86 | HR 85 | Ht 64.0 in | Wt 172.6 lb

## 2019-03-17 DIAGNOSIS — Z0000 Encounter for general adult medical examination without abnormal findings: Secondary | ICD-10-CM | POA: Diagnosis not present

## 2019-03-17 DIAGNOSIS — R7989 Other specified abnormal findings of blood chemistry: Secondary | ICD-10-CM | POA: Diagnosis not present

## 2019-03-17 DIAGNOSIS — D473 Essential (hemorrhagic) thrombocythemia: Secondary | ICD-10-CM

## 2019-03-17 DIAGNOSIS — D75839 Thrombocytosis, unspecified: Secondary | ICD-10-CM

## 2019-03-17 NOTE — Progress Notes (Signed)
Subjective:    Patient ID: Madison Conway, female    DOB: 1971-05-19, 48 y.o.   MRN: [id number]  Madison Conway is a 48 y.o. female presenting on 03/17/2019 for Annual Exam   HPI Annual Physical Exam Patient has been feeling well.  They have no acute concerns today. Sleeps 8 hours per night uninterrupted.  HEALTH MAINTENANCE: Weight/BMI: slowly increasing, noticing in clothing Physical activity: stays active and gets 5 miles on fitbit every day.  Walks at lunch times.  Diet: Restaurant meals 3-4 times per week - patient already 1/2s portions when it arrives at the table (turns into 6-8 meals per week) Seatbelt: always Sunscreen: regularly PAP: up to date Mammogram: completed 01/2019 Colon Cancer Screen: No family history of colon cancer HIV/HEP C: neg Optometry: Regularly Dentistry: regularly  VACCINES: Tetanus: up to date Influenza: Received already   Past Medical History:  Diagnosis Date  . Asthma    childhood.   Past Surgical History:  Procedure Laterality Date  . NO PAST SURGERIES     Social History   Socioeconomic History  . Marital status: Single    Spouse name: Not on file  . Number of children: Not on file  . Years of education: Not on file  . Highest education level: Not on file  Occupational History  . Not on file  Social Needs  . Financial resource strain: Not on file  . Food insecurity    Worry: Not on file    Inability: Not on file  . Transportation needs    Medical: Not on file    Non-medical: Not on file  Tobacco Use  . Smoking status: Never Smoker  . Smokeless tobacco: Never Used  Substance and Sexual Activity  . Alcohol use: Yes    Alcohol/week: 2.0 - 3.0 standard drinks    Types: 2 - 3 Glasses of wine per week  . Drug use: No  . Sexual activity: Yes    Birth control/protection: Pill  Lifestyle  . Physical activity    Days per week: Not on file    Minutes per session: Not on file  . Stress: Not on file  Relationships  .  Social Herbalist on phone: Not on file    Gets together: Not on file    Attends religious service: Not on file    Active member of club or organization: Not on file    Attends meetings of clubs or organizations: Not on file    Relationship status: Not on file  . Intimate partner violence    Fear of current or ex partner: Not on file    Emotionally abused: Not on file    Physically abused: Not on file    Forced sexual activity: Not on file  Other Topics Concern  . Not on file  Social History Narrative  . Not on file   Family History  Problem Relation Age of Onset  . Diabetes Maternal Grandmother   . Breast cancer Neg Hx   . Colon cancer Neg Hx   . Ovarian cancer Neg Hx    Current Outpatient Medications on File Prior to Visit  Medication Sig  . ALAYCEN 1/35 tablet TAKE 1 TABLET BY MOUTH EVERY DAY  . amLODipine (NORVASC) 10 MG tablet Take 1 tablet (10 mg total) by mouth daily.  . ferrous sulfate (FER-IRON) 75 (15 Fe) MG/ML SOLN Take by mouth.  . hydrochlorothiazide (HYDRODIURIL) 12.5 MG tablet Take 1 tablet (12.5 mg total)  by mouth daily.  . Multiple Vitamins-Minerals (MULTIVITAMIN ADULT PO) Take 1 tablet by mouth.   No current facility-administered medications on file prior to visit.     Review of Systems Per HPI unless specifically indicated above     Objective:    BP 127/86 (BP Location: Left Arm, Patient Position: Sitting, Cuff Size: Normal)   Pulse 85   Ht 5\' 4"  (1.626 m)   Wt 172 lb 9.6 oz (78.3 kg)   LMP 02/16/2019   BMI 29.63 kg/m   Wt Readings from Last 3 Encounters:  03/17/19 172 lb 9.6 oz (78.3 kg)  07/10/18 164 lb 12.8 oz (74.8 kg)  05/06/18 161 lb 9.6 oz (73.3 kg)    Physical Exam Vitals signs and nursing note reviewed.  Constitutional:      General: She is not in acute distress.    Appearance: Normal appearance. She is well-developed.     Comments: Overweight  HENT:     Head: Normocephalic and atraumatic.     Right Ear: Tympanic  membrane, ear canal and external ear normal.     Left Ear: Tympanic membrane, ear canal and external ear normal.     Nose: Nose normal.     Mouth/Throat:     Mouth: Mucous membranes are moist.     Pharynx: Oropharynx is clear.  Eyes:     Conjunctiva/sclera: Conjunctivae normal.     Pupils: Pupils are equal, round, and reactive to light.  Neck:     Musculoskeletal: Normal range of motion and neck supple.     Thyroid: No thyromegaly.     Vascular: No JVD.     Trachea: No tracheal deviation.  Cardiovascular:     Rate and Rhythm: Normal rate and regular rhythm.     Pulses: Normal pulses.     Heart sounds: Normal heart sounds. No murmur. No friction rub. No gallop.   Pulmonary:     Effort: Pulmonary effort is normal. No respiratory distress.     Breath sounds: Normal breath sounds.  Abdominal:     General: Abdomen is flat. Bowel sounds are normal. There is no distension.     Palpations: Abdomen is soft.     Tenderness: There is no abdominal tenderness.  Musculoskeletal: Normal range of motion.  Lymphadenopathy:     Cervical: No cervical adenopathy.  Skin:    General: Skin is warm and dry.     Capillary Refill: Capillary refill takes less than 2 seconds.  Neurological:     General: No focal deficit present.     Mental Status: She is alert and oriented to person, place, and time. Mental status is at baseline.     Cranial Nerves: No cranial nerve deficit.     Gait: Gait normal.  Psychiatric:        Mood and Affect: Mood normal.        Behavior: Behavior normal.        Thought Content: Thought content normal.        Judgment: Judgment normal.    Results for orders placed or performed during the hospital encounter of 04/13/18  Comprehensive metabolic panel  Result Value Ref Range   Sodium 136 135 - 145 mmol/L   Potassium 4.1 3.5 - 5.1 mmol/L   Chloride 103 98 - 111 mmol/L   CO2 25 22 - 32 mmol/L   Glucose, Bld 99 70 - 99 mg/dL   BUN 10 6 - 20 mg/dL   Creatinine, Ser 0.95  0.44 - 1.00  mg/dL   Calcium 9.0 8.9 - 10.3 mg/dL   Total Protein 7.5 6.5 - 8.1 g/dL   Albumin 3.7 3.5 - 5.0 g/dL   AST 19 15 - 41 U/L   ALT 20 0 - 44 U/L   Alkaline Phosphatase 39 38 - 126 U/L   Total Bilirubin 1.0 0.3 - 1.2 mg/dL   GFR calc non Af Amer >60 >60 mL/min   GFR calc Af Amer >60 >60 mL/min   Anion gap 8 5 - 15  CBC w/Diff/Platelet  Result Value Ref Range   WBC 9.3 4.0 - 10.5 K/uL   RBC 4.19 3.87 - 5.11 MIL/uL   Hemoglobin 11.3 (L) 12.0 - 15.0 g/dL   HCT 36.2 36.0 - 46.0 %   MCV 86.4 80.0 - 100.0 fL   MCH 27.0 26.0 - 34.0 pg   MCHC 31.2 30.0 - 36.0 g/dL   RDW 15.1 11.5 - 15.5 %   Platelets 440 (H) 150 - 400 K/uL   nRBC 0.0 0.0 - 0.2 %   Neutrophils Relative % 67 %   Neutro Abs 6.4 1.7 - 7.7 K/uL   Lymphocytes Relative 23 %   Lymphs Abs 2.1 0.7 - 4.0 K/uL   Monocytes Relative 6 %   Monocytes Absolute 0.6 0.1 - 1.0 K/uL   Eosinophils Relative 2 %   Eosinophils Absolute 0.1 0.0 - 0.5 K/uL   Basophils Relative 1 %   Basophils Absolute 0.1 0.0 - 0.1 K/uL   Immature Granulocytes 1 %   Abs Immature Granulocytes 0.05 0.00 - 0.07 K/uL      Assessment & Plan:   Problem List Items Addressed This Visit    None    Visit Diagnoses    Annual physical exam    -  Primary   Relevant Orders   TSH   Lipid panel (Completed)   CBC with Differential/Platelet (Completed)   Hemoglobin A1c   Comprehensive metabolic panel (Completed)    Annual physical exam with no new findings.  Well adult with no acute concerns.  Plan: 1. Obtain health maintenance screenings as above according to age. - Increase physical activity to 30 minutes most days of the week.  - Eat healthy diet high in vegetables and fruits; low in refined carbohydrates. - Screening labs and tests as ordered 2. Return 1 year for annual physical.     Follow up plan: Return in about 1 year (around 03/16/2020) for annual physical AND 6 mos hypertension.  Cassell Smiles, DNP, AGPCNP-BC Adult Gerontology Primary  Care Nurse Practitioner [facility] 03/17/2019, 8:50 AM

## 2019-03-17 NOTE — Patient Instructions (Addendum)
Madison Conway,   Thank you for coming in to clinic today.  1. Reminders for food: - Log your food and see if there are any areas of improvement. - Keep making your healthier food choices - Re-examine portion size.  Measure servings when making your plate at home.  2. How To Practice Mindful Eating To practice mindfulness with eating, you'll need a series of exercises that you make part of your daily routine.  There are many simple ways to get started, some of which can have powerful benefits on their own.  It is best to use several of the following in combination for best results: . Eat more slowly and don't rush your meals.  Sarina Ser thoroughly.  . Eliminate distractions by turning off the TV and putting down your phone.  . Eat in silence or with other people at a dinner table. . Focus on how the food makes you feel.  . Stop eating when you're full.  . Ask yourself why you're eating. Are you actually hungry? Is it healthy? To begin with, it is a good idea to pick one meal per day, to focus on these points. Once you've got the hang of this, mindfulness will become more natural. Then you can focus on implementing these habits into more meals.  Please schedule a follow-up appointment with Cassell Smiles, AGNP. No follow-ups on file.  If you have any other questions or concerns, please feel free to call the clinic or send a message through [place]. You may also schedule an earlier appointment if necessary.  You will receive a survey after today's visit either digitally by e-mail or paper by [employer]. Your experiences and feedback matter to [place].  Please respond so we know how we are doing as we provide care for you.   Cassell Smiles, DNP, AGNP-BC Adult Gerontology Nurse Practitioner [place]

## 2019-03-23 ENCOUNTER — Other Ambulatory Visit: Payer: Self-pay | Admitting: Nurse Practitioner

## 2019-03-23 DIAGNOSIS — Z0001 Encounter for general adult medical examination with abnormal findings: Secondary | ICD-10-CM

## 2019-03-23 DIAGNOSIS — Z3041 Encounter for surveillance of contraceptive pills: Secondary | ICD-10-CM

## 2019-03-23 LAB — CBC WITH DIFFERENTIAL/PLATELET
Absolute Monocytes: 538 cells/uL (ref 200–950)
Basophils Absolute: 48 cells/uL (ref 0–200)
Basophils Relative: 0.7 %
Eosinophils Absolute: 276 cells/uL (ref 15–500)
Eosinophils Relative: 4 %
HCT: 38.1 % (ref 35.0–45.0)
Hemoglobin: 12.3 g/dL (ref 11.7–15.5)
Lymphs Abs: 1711 cells/uL (ref 850–3900)
MCH: 27.3 pg (ref 27.0–33.0)
MCHC: 32.3 g/dL (ref 32.0–36.0)
MCV: 84.7 fL (ref 80.0–100.0)
MPV: 8.6 fL (ref 7.5–12.5)
Monocytes Relative: 7.8 %
Neutro Abs: 4326 cells/uL (ref 1500–7800)
Neutrophils Relative %: 62.7 %
Platelets: 486 10*3/uL — ABNORMAL HIGH (ref 140–400)
RBC: 4.5 10*6/uL (ref 3.80–5.10)
RDW: 14 % (ref 11.0–15.0)
Total Lymphocyte: 24.8 %
WBC: 6.9 10*3/uL (ref 3.8–10.8)

## 2019-03-23 LAB — COMPREHENSIVE METABOLIC PANEL
AG Ratio: 1.3 (calc) (ref 1.0–2.5)
ALT: 21 U/L (ref 6–29)
AST: 18 U/L (ref 10–35)
Albumin: 4.1 g/dL (ref 3.6–5.1)
Alkaline phosphatase (APISO): 44 U/L (ref 31–125)
BUN: 9 mg/dL (ref 7–25)
CO2: 28 mmol/L (ref 20–32)
Calcium: 9.4 mg/dL (ref 8.6–10.2)
Chloride: 98 mmol/L (ref 98–110)
Creat: 1 mg/dL (ref 0.50–1.10)
Globulin: 3.2 g/dL (calc) (ref 1.9–3.7)
Glucose, Bld: 95 mg/dL (ref 65–99)
Potassium: 3.9 mmol/L (ref 3.5–5.3)
Sodium: 136 mmol/L (ref 135–146)
Total Bilirubin: 0.8 mg/dL (ref 0.2–1.2)
Total Protein: 7.3 g/dL (ref 6.1–8.1)

## 2019-03-23 LAB — LIPID PANEL
Cholesterol: 213 mg/dL — ABNORMAL HIGH (ref <200)
HDL: 109 mg/dL (ref >=50)
LDL Cholesterol (Calc): 84 mg/dL (calc)
Non-HDL Cholesterol (Calc): 104 mg/dL (calc) (ref <130)
Total CHOL/HDL Ratio: 2 (calc) (ref <5.0)
Triglycerides: 105 mg/dL (ref <150)

## 2019-03-23 LAB — PATHOLOGIST SMEAR REVIEW

## 2019-03-24 DIAGNOSIS — D75839 Thrombocytosis, unspecified: Secondary | ICD-10-CM | POA: Insufficient documentation

## 2019-03-24 DIAGNOSIS — D473 Essential (hemorrhagic) thrombocythemia: Secondary | ICD-10-CM | POA: Insufficient documentation

## 2019-03-25 ENCOUNTER — Encounter: Payer: Self-pay | Admitting: Nurse Practitioner

## 2019-03-25 DIAGNOSIS — D75839 Thrombocytosis, unspecified: Secondary | ICD-10-CM

## 2019-03-25 DIAGNOSIS — D473 Essential (hemorrhagic) thrombocythemia: Secondary | ICD-10-CM

## 2019-03-29 DIAGNOSIS — D473 Essential (hemorrhagic) thrombocythemia: Secondary | ICD-10-CM | POA: Diagnosis not present

## 2019-03-31 LAB — PATHOLOGIST SMEAR REVIEW
Basophils Absolute: 0 10*3/uL (ref 0.0–0.2)
Basos: 1 %
EOS (ABSOLUTE): 0.2 10*3/uL (ref 0.0–0.4)
Eos: 2 %
Hematocrit: 33.5 % — ABNORMAL LOW (ref 34.0–46.6)
Hemoglobin: 10.9 g/dL — ABNORMAL LOW (ref 11.1–15.9)
Immature Grans (Abs): 0 10*3/uL (ref 0.0–0.1)
Immature Granulocytes: 0 %
Lymphocytes Absolute: 2.2 10*3/uL (ref 0.7–3.1)
Lymphs: 25 %
MCH: 26.9 pg (ref 26.6–33.0)
MCHC: 32.5 g/dL (ref 31.5–35.7)
MCV: 83 fL (ref 79–97)
Monocytes Absolute: 0.6 10*3/uL (ref 0.1–0.9)
Monocytes: 7 %
Neutrophils Absolute: 5.6 10*3/uL (ref 1.4–7.0)
Neutrophils: 65 %
Platelets: 447 10*3/uL (ref 150–450)
RBC: 4.05 x10E6/uL (ref 3.77–5.28)
RDW: 13.9 % (ref 11.7–15.4)
WBC: 8.8 10*3/uL (ref 3.4–10.8)

## 2019-08-07 ENCOUNTER — Other Ambulatory Visit: Payer: Self-pay | Admitting: Nurse Practitioner

## 2019-08-07 DIAGNOSIS — I10 Essential (primary) hypertension: Secondary | ICD-10-CM

## 2019-08-19 ENCOUNTER — Other Ambulatory Visit: Payer: Self-pay | Admitting: Family Medicine

## 2019-08-19 DIAGNOSIS — I10 Essential (primary) hypertension: Secondary | ICD-10-CM

## 2019-09-05 ENCOUNTER — Other Ambulatory Visit: Payer: Self-pay | Admitting: Family Medicine

## 2019-09-05 DIAGNOSIS — I10 Essential (primary) hypertension: Secondary | ICD-10-CM

## 2019-09-29 ENCOUNTER — Other Ambulatory Visit: Payer: Self-pay | Admitting: Family Medicine

## 2019-09-29 DIAGNOSIS — I10 Essential (primary) hypertension: Secondary | ICD-10-CM

## 2019-12-08 ENCOUNTER — Other Ambulatory Visit: Payer: Self-pay | Admitting: Family Medicine

## 2019-12-08 DIAGNOSIS — I10 Essential (primary) hypertension: Secondary | ICD-10-CM

## 2019-12-08 NOTE — Telephone Encounter (Signed)
Requested medication (s) are due for refill today - yes  Requested medication (s) are on the active medication list -yes  Future visit scheduled -yes  Last refill: 11/15/19  Notes to clinic: Call to patient to schedule 6 month medication f/u appointment. Patient declines to make appointment today- she has been monitoring her BP and will send record- she has scheduled annual exam in September. Told patient I would send request to PCP  Requested Prescriptions  Pending Prescriptions Disp Refills   hydrochlorothiazide (HYDRODIURIL) 12.5 MG tablet [Pharmacy Med Name: HYDROCHLOROTHIAZIDE 12.5 MG TB] 30 tablet 0    Sig: TAKE 1 TABLET BY MOUTH EVERY DAY      Cardiovascular: Diuretics - Thiazide Failed - 12/08/2019  4:16 PM      Failed - Valid encounter within last 6 months    Recent Outpatient Visits           8 months ago Annual physical exam   [facility] Mikey College, NP   1 year ago Essential hypertension   [facility] Merrilyn Puma, Jerrel Ivory, NP   1 year ago Essential hypertension   [facility] Merrilyn Puma, Jerrel Ivory, NP   1 year ago Encounter for general adult medical examination with abnormal findings   [facility] Merrilyn Puma, Jerrel Ivory, NP   3 years ago Encounter for annual physical exam   [facility] Merrilyn Puma, Jerrel Ivory, NP              Passed - Ca in normal range and within 360 days    Calcium  Date Value Ref Range Status  03/17/2019 9.4 8.6 - 10.2 mg/dL Final          Passed - Cr in normal range and within 360 days    Creat  Date Value Ref Range Status  03/17/2019 1.00 0.50 - 1.10 mg/dL Final          Passed - K in normal range and within 360 days    Potassium  Date Value Ref Range Status  03/17/2019 3.9 3.5 - 5.3 mmol/L Final          Passed - Na in normal range and within 360 days    Sodium  Date Value Ref Range Status  03/17/2019 136 135 - 146 mmol/L Final   07/09/2016 138 134 - 144 mmol/L Final          Passed - Last BP in normal range    BP Readings from Last 1 Encounters:  03/17/19 127/86              Requested Prescriptions  Pending Prescriptions Disp Refills   hydrochlorothiazide (HYDRODIURIL) 12.5 MG tablet [Pharmacy Med Name: HYDROCHLOROTHIAZIDE 12.5 MG TB] 30 tablet 0    Sig: TAKE 1 TABLET BY MOUTH EVERY DAY      Cardiovascular: Diuretics - Thiazide Failed - 12/08/2019  4:16 PM      Failed - Valid encounter within last 6 months    Recent Outpatient Visits           8 months ago Annual physical exam   [facility] Mikey College, NP   1 year ago Essential hypertension   [facility] Mikey College, NP   1 year ago Essential hypertension   [facility] Merrilyn Puma, Jerrel Ivory, NP   1 year ago Encounter for general adult medical examination with abnormal findings   [facility]  Mikey College, NP   3 years ago Encounter for annual physical exam   [facility] Mikey College, NP              Passed - Ca in normal range and within 360 days    Calcium  Date Value Ref Range Status  03/17/2019 9.4 8.6 - 10.2 mg/dL Final          Passed - Cr in normal range and within 360 days    Creat  Date Value Ref Range Status  03/17/2019 1.00 0.50 - 1.10 mg/dL Final          Passed - K in normal range and within 360 days    Potassium  Date Value Ref Range Status  03/17/2019 3.9 3.5 - 5.3 mmol/L Final          Passed - Na in normal range and within 360 days    Sodium  Date Value Ref Range Status  03/17/2019 136 135 - 146 mmol/L Final  07/09/2016 138 134 - 144 mmol/L Final          Passed - Last BP in normal range    BP Readings from Last 1 Encounters:  03/17/19 127/86

## 2019-12-09 NOTE — Telephone Encounter (Signed)
Needs an appointment every 6 months for HTN, not just once yearly at annual wellness visit.

## 2019-12-09 NOTE — Telephone Encounter (Signed)
I  Called pt  Back to schedule appt she said that would wait until her  Physical to get the medication filled

## 2020-01-26 ENCOUNTER — Other Ambulatory Visit: Payer: Self-pay | Admitting: Family Medicine

## 2020-01-26 ENCOUNTER — Encounter: Payer: Self-pay | Admitting: Family Medicine

## 2020-01-26 DIAGNOSIS — Z1231 Encounter for screening mammogram for malignant neoplasm of breast: Secondary | ICD-10-CM

## 2020-01-26 NOTE — Progress Notes (Signed)
Patient requested order for Mammogram.  Order placed.

## 2020-02-17 ENCOUNTER — Other Ambulatory Visit: Payer: Self-pay

## 2020-02-17 ENCOUNTER — Ambulatory Visit
Admission: RE | Admit: 2020-02-17 | Discharge: 2020-02-17 | Disposition: A | Discharge status: Home / Self Care | Payer: BC Managed Care – PPO | Source: Ambulatory Visit | Attending: Family Medicine | Admitting: Family Medicine

## 2020-02-17 DIAGNOSIS — Z1231 Encounter for screening mammogram for malignant neoplasm of breast: Secondary | ICD-10-CM | POA: Diagnosis not present

## 2020-03-17 ENCOUNTER — Encounter: Payer: Self-pay | Admitting: Family Medicine

## 2020-03-17 ENCOUNTER — Other Ambulatory Visit: Payer: Self-pay

## 2020-03-17 ENCOUNTER — Other Ambulatory Visit: Payer: Self-pay | Admitting: Family Medicine

## 2020-03-17 ENCOUNTER — Ambulatory Visit (INDEPENDENT_AMBULATORY_CARE_PROVIDER_SITE_OTHER): Payer: BC Managed Care – PPO | Admitting: Family Medicine

## 2020-03-17 VITALS — BP 132/80 | HR 92 | Temp 98.5°F | Resp 18 | Ht 64.0 in | Wt 173.8 lb

## 2020-03-17 DIAGNOSIS — Z0000 Encounter for general adult medical examination without abnormal findings: Secondary | ICD-10-CM

## 2020-03-17 DIAGNOSIS — D473 Essential (hemorrhagic) thrombocythemia: Secondary | ICD-10-CM

## 2020-03-17 DIAGNOSIS — D508 Other iron deficiency anemias: Secondary | ICD-10-CM

## 2020-03-17 DIAGNOSIS — R635 Abnormal weight gain: Secondary | ICD-10-CM

## 2020-03-17 DIAGNOSIS — R8290 Unspecified abnormal findings in urine: Secondary | ICD-10-CM

## 2020-03-17 DIAGNOSIS — D75839 Thrombocytosis, unspecified: Secondary | ICD-10-CM

## 2020-03-17 DIAGNOSIS — Z3041 Encounter for surveillance of contraceptive pills: Secondary | ICD-10-CM

## 2020-03-17 DIAGNOSIS — Z0001 Encounter for general adult medical examination with abnormal findings: Secondary | ICD-10-CM

## 2020-03-17 DIAGNOSIS — Z79899 Other long term (current) drug therapy: Secondary | ICD-10-CM | POA: Diagnosis not present

## 2020-03-17 DIAGNOSIS — I10 Essential (primary) hypertension: Secondary | ICD-10-CM | POA: Diagnosis not present

## 2020-03-17 LAB — POCT URINALYSIS DIPSTICK
Bilirubin, UA: NEGATIVE
Blood, UA: NEGATIVE
Glucose, UA: NEGATIVE
Leukocytes, UA: NEGATIVE
Nitrite, UA: NEGATIVE
Protein, UA: NEGATIVE
Spec Grav, UA: 1.025 (ref 1.010–1.025)
Urobilinogen, UA: 0.2 E.U./dL
pH, UA: 5 (ref 5.0–8.0)

## 2020-03-17 MED ORDER — ALYACEN 1/35 1-35 MG-MCG PO TABS: 1.0000 | ORAL_TABLET | Freq: Every day | ORAL | 12 refills | Status: DC

## 2020-03-17 MED ORDER — HYDROCHLOROTHIAZIDE 12.5 MG PO TABS: 12.5000 mg | ORAL_TABLET | Freq: Every day | ORAL | 1 refills | Status: DC

## 2020-03-17 NOTE — Assessment & Plan Note (Signed)
Annual physical exam without new findings.  Well adult with no acute concerns.  Plan: 1. Obtain health maintenance screenings as above according to age. - Increase physical activity to 30 minutes most days of the week.  - Eat healthy diet high in vegetables and fruits; low in refined carbohydrates. - Screening labs and tests as ordered 2. Return 1 year for annual physical.  

## 2020-03-17 NOTE — Assessment & Plan Note (Signed)
Likely over-controlled hypertension, when taking amlodipine 10mg  and HCTZ 12.5mg  having swelling and BP readings 98-110/54-60's.  Has stopped both medications and BP is running 130's/80's.  Discussed stopping amlodipine and restarting HCTZ 12.5mg , to ensure is staying hydrated, and to recheck BP daily over the next 2 weeks. Complications: Overweight  Plan: 1. STOP Amlodipine and BEGIN HCTZ 12.5mg  daily 2. Obtain labs today  3. Encouraged heart healthy diet and increasing exercise to 30 minutes most days of the week, going no more than 2 days in a row without exercise. 4. Check BP 1-2 x per week at home, keep log, and bring to clinic at next appointment. 5. Follow up 2 weeks.

## 2020-03-17 NOTE — Assessment & Plan Note (Signed)
Status unknown.  Recheck labs.  Followup after labs.  

## 2020-03-17 NOTE — Patient Instructions (Signed)
Have your labs drawn and we will contact you with the results.  STOP the amlodipine and CONTINUE the hydrochlorothiazide 12.5mg  daily.  Take your blood pressure 1-2x per day for the next 2 weeks.  We can plan to follow up on your blood pressure in 2 weeks (virtually is ok) to reassess your blood pressure control.  Well Visit: Care Instructions Overview  Well visits can help you stay healthy. Your provider has checked your overall health and may have suggested ways to take good care of yourself. Your provider also may have recommended tests. At home, you can help prevent illness with healthy eating, regular exercise, and other steps.  Follow-up care is a key part of your treatment and safety. Be sure to make and go to all appointments, and call your provider if you are having problems. It's also a good idea to know your test results and keep a list of the medicines you take.  How can you care for yourself at home?   Get screening tests that you and your doctor decide on. Screening helps find diseases before any symptoms appear.   Eat healthy foods. Choose fruits, vegetables, whole grains, protein, and low-fat dairy foods. Limit fat, especially saturated fat. Reduce salt in your diet.   Limit alcohol. If you are a man, have no more than 2 drinks a day or 14 drinks a week. If you are a woman, have no more than 1 drink a day or 7 drinks a week.   Get at least 30 minutes of physical activity on most days of the week.  We recommend you go no more than 2 days in a row without exercise. Walking is a good choice. You also may want to do other activities, such as running, swimming, cycling, or playing tennis or team sports. Discuss any changes in your exercise program with your provider.   Reach and stay at a healthy weight. This will lower your risk for many problems, such as obesity, diabetes, heart disease, and high blood pressure.   Do not smoke or allow others to smoke around you. If you need  help quitting, talk to your provider about stop-smoking programs and medicines. These can increase your chances of quitting for good.  Can call 1-800-QUIT-NOW 3617337803) for the [facility], assistance with smoking cessation.   Care for your mental health. It is easy to get weighed down by worry and stress. Learn strategies to manage stress, like deep breathing and mindfulness, and stay connected with your family and community. If you find you often feel sad or hopeless, talk with your provider. Treatment can help.   Talk to your provider about whether you have any risk factors for sexually transmitted infections (STIs). You can help prevent STIs if you wait to have sex with a new partner (or partners) until you've each been tested for STIs. It also helps if you use condoms (female or female condoms) and if you limit your sex partners to one person who only has sex with you. Vaccines are available for some STIs, such as HPV (these are age dependent).   If you think you may have a problem with alcohol or drug use, talk to your provider. This includes prescription medicines (such as amphetamines and opioids) and illegal drugs (such as cocaine and methamphetamine). Your provider can help you figure out what type of treatment is best for you.   If you have concerns about domestic violence or intimate partner violence, there are resources available to  you. National Domestic Abuse Hotline 949 577 0789   Protect your skin from too much sun. When you're outdoors from 10 a.m. to 4 p.m., stay in the shade or cover up with clothing and a hat with a wide brim. Wear sunglasses that block UV rays. Even when it's cloudy, put broad-spectrum sunscreen (SPF 30 or higher) on any exposed skin.   See a dentist one or two times a year for checkups and to have your teeth cleaned.   See an eye doctor once per year for an eye exam.   Wear a seat belt in the car.  When should you call for  help?  Watch closely for changes in your health, and be sure to contact your provider if you have any problems or symptoms that concern you.  We will plan to see you back in 2 weeks for hypertension follow up visit (Can be virtual)  You will receive a survey after today's visit either digitally by e-mail or paper by [employer]. Your experiences and feedback matter to [place].  Please respond so we know how we are doing as we provide care for you.  Call us with any questions/concerns/needs.  It is my goal to be available to you for your health concerns.  Thanks for choosing me to be a partner in your healthcare needs!  Harlin Rain, FNP-C Family Nurse Practitioner [facility] Phone: [phone]

## 2020-03-17 NOTE — Addendum Note (Signed)
Addended by: Wilson Singer on: 03/17/2020 04:45 PM   Modules accepted: Orders

## 2020-03-17 NOTE — Progress Notes (Signed)
Subjective:    Patient ID: Madison Conway, female    DOB: 1970-08-08, 49 y.o.   MRN: [id number]  Madison Conway is a 49 y.o. female presenting on 03/17/2020 for Annual Exam   HPI  HEALTH MAINTENANCE:  Weight/BMI: Overweight, BMI 29.83% Physical activity: Stays active Diet: Regular Seatbelt: Yes Sunscreen: Yes Mammogram: Negative - BI-RADS 1 02/17/2020, repeat 1 year or at 52, as discussed PAP: Completed 10/07/2016, NIL and negative HPV, repeat in 5 years HIV & Hep C Screening: Offered and declined GC/CT: Offered and declined Optometry: Every 2 years Dentistry: Goes regularly  IMMUNIZATIONS: Influenza: Has done yearly with work Tetanus: Up to date, 12/07/2015 COVID: Up to date, Moderna, 09/29/2019, 10/27/2019  Depression screen [facility] 2/9 03/17/2020 03/17/2019 12/31/2017  Decreased Interest 0 0 0  Down, Depressed, Hopeless 0 0 0  PHQ - 2 Score 0 0 0  Altered sleeping - - 0  Tired, decreased energy - - 0  Change in appetite - - 0  Feeling bad or failure about yourself  - - 0  Trouble concentrating - - 0  Moving slowly or fidgety/restless - - 0  Suicidal thoughts - - 0  PHQ-9 Score - - 0  Difficult doing work/chores - - Not difficult at all    Past Medical History:  Diagnosis Date  . Asthma    childhood.   Past Surgical History:  Procedure Laterality Date  . NO PAST SURGERIES     Social History   Socioeconomic History  . Marital status: Single    Spouse name: Not on file  . Number of children: Not on file  . Years of education: Not on file  . Highest education level: Not on file  Occupational History  . Not on file  Tobacco Use  . Smoking status: Never Smoker  . Smokeless tobacco: Never Used  Vaping Use  . Vaping Use: Never used  Substance and Sexual Activity  . Alcohol use: Yes    Alcohol/week: 2.0 - 3.0 standard drinks    Types: 2 - 3 Standard drinks or equivalent per week  . Drug use: No  . Sexual activity: Yes    Birth control/protection:  Pill  Other Topics Concern  . Not on file  Social History Narrative  . Not on file   Social Determinants of Health   Financial Resource Strain:   . Difficulty of Paying Living Expenses: Not on file  Food Insecurity:   . Worried About Charity fundraiser in the Last Year: Not on file  . Ran Out of Food in the Last Year: Not on file  Transportation Needs:   . Lack of Transportation (Medical): Not on file  . Lack of Transportation (Non-Medical): Not on file  Physical Activity:   . Days of Exercise per Week: Not on file  . Minutes of Exercise per Session: Not on file  Stress:   . Feeling of Stress : Not on file  Social Connections:   . Frequency of Communication with Friends and Family: Not on file  . Frequency of Social Gatherings with Friends and Family: Not on file  . Attends Religious Services: Not on file  . Active Member of Clubs or Organizations: Not on file  . Attends Archivist Meetings: Not on file  . Marital Status: Not on file  Intimate Partner Violence:   . Fear of Current or Ex-Partner: Not on file  . Emotionally Abused: Not on file  . Physically Abused: Not on file  .  Sexually Abused: Not on file   Family History  Problem Relation Age of Onset  . Diabetes Maternal Grandmother   . Breast cancer Neg Hx   . Colon cancer Neg Hx   . Ovarian cancer Neg Hx    Current Outpatient Medications on File Prior to Visit  Medication Sig  . ALAYCEN 1/35 tablet TAKE 1 TABLET BY MOUTH EVERY DAY  . famotidine (PEPCID) 10 MG tablet Take 10 mg by mouth daily as needed for heartburn or indigestion.  . ferrous sulfate (FER-IRON) 75 (15 Fe) MG/ML SOLN Take 15 mg of iron by mouth 3 (three) times a week.   . Multiple Vitamins-Minerals (MULTIVITAMIN ADULT PO) Take 1 tablet by mouth every other day.    No current facility-administered medications on file prior to visit.    Per HPI unless specifically indicated above     Objective:    BP 132/80 (BP Location: Right Arm,  Patient Position: Sitting, Cuff Size: Large)   Pulse 92   Temp 98.5 F (36.9 C) (Oral)   Resp 18   Ht 5\' 4"  (1.626 m)   Wt 173 lb 12.8 oz (78.8 kg)   LMP 03/03/2020   SpO2 100%   BMI 29.83 kg/m   Wt Readings from Last 3 Encounters:  03/17/20 173 lb 12.8 oz (78.8 kg)  03/17/19 172 lb 9.6 oz (78.3 kg)  07/10/18 164 lb 12.8 oz (74.8 kg)    Physical Exam Vitals reviewed.  Constitutional:      General: She is not in acute distress.    Appearance: Normal appearance. She is well-developed, well-groomed and overweight. She is not ill-appearing or toxic-appearing.  HENT:     Head: Normocephalic and atraumatic.     Right Ear: Tympanic membrane, ear canal and external ear normal. There is no impacted cerumen.     Left Ear: Tympanic membrane, ear canal and external ear normal. There is no impacted cerumen.     Nose: Nose normal. No congestion or rhinorrhea.     Mouth/Throat:     Lips: Pink.     Mouth: Mucous membranes are moist.     Pharynx: Oropharynx is clear. Uvula midline. No oropharyngeal exudate or posterior oropharyngeal erythema.  Eyes:     General: Lids are normal. Vision grossly intact. No scleral icterus.       Right eye: No discharge.        Left eye: No discharge.     Extraocular Movements: Extraocular movements intact.     Conjunctiva/sclera: Conjunctivae normal.     Pupils: Pupils are equal, round, and reactive to light.  Neck:     Thyroid: No thyroid mass or thyromegaly.  Cardiovascular:     Rate and Rhythm: Normal rate and regular rhythm.     Pulses: Normal pulses.          Dorsalis pedis pulses are 2+ on the right side and 2+ on the left side.     Heart sounds: Normal heart sounds. No murmur heard.  No friction rub. No gallop.   Pulmonary:     Effort: Pulmonary effort is normal. No respiratory distress.     Breath sounds: Normal breath sounds.  Abdominal:     General: Abdomen is flat. Bowel sounds are normal. There is no distension.     Palpations: Abdomen is  soft. There is no hepatomegaly, splenomegaly or mass.     Tenderness: There is no abdominal tenderness. There is no guarding or rebound.     Hernia: No hernia  is present.  Musculoskeletal:        General: Normal range of motion.     Cervical back: Normal range of motion and neck supple. No tenderness.     Right lower leg: No edema.     Left lower leg: No edema.     Comments: Normal tone, strength 5/5 BUE & BLE  Feet:     Right foot:     Skin integrity: Skin integrity normal.     Left foot:     Skin integrity: Skin integrity normal.  Lymphadenopathy:     Cervical: No cervical adenopathy.  Skin:    General: Skin is warm and dry.     Capillary Refill: Capillary refill takes less than 2 seconds.  Neurological:     General: No focal deficit present.     Mental Status: She is alert and oriented to person, place, and time.     Cranial Nerves: No cranial nerve deficit.     Sensory: No sensory deficit.     Motor: No weakness.     Coordination: Coordination normal.     Gait: Gait normal.     Deep Tendon Reflexes: Reflexes normal.  Psychiatric:        Attention and Perception: Attention and perception normal.        Mood and Affect: Mood and affect normal.        Speech: Speech normal.        Behavior: Behavior normal. Behavior is cooperative.        Thought Content: Thought content normal.        Cognition and Memory: Cognition and memory normal.        Judgment: Judgment normal.     Results for orders placed or performed in visit on 03/25/19  Pathologist smear review  Result Value Ref Range   Path Rev WBC Comment    Path Rev RBC Comment    Path Rev PLTs Comment    PATH INTERP BLD-IMP Comment    PATHOLOGIST NAME Comment    WBC 8.8 3.4 - 10.8 x10E3/uL   RBC 4.05 3.77 - 5.28 x10E6/uL   Hemoglobin 10.9 (L) 11.1 - 15.9 g/dL   Hematocrit 33.5 (L) 34.0 - 46.6 %   MCV 83 79 - 97 fL   MCH 26.9 26.6 - 33.0 pg   MCHC 32.5 31 - 35 g/dL   RDW 13.9 11.7 - 15.4 %   Platelets 447 150 -  450 x10E3/uL   Neutrophils 65 Not Estab. %   Lymphs 25 Not Estab. %   Monocytes 7 Not Estab. %   Eos 2 Not Estab. %   Basos 1 Not Estab. %   Neutrophils Absolute 5.6 1 - 7 x10E3/uL   Lymphocytes Absolute 2.2 0 - 3 x10E3/uL   Monocytes Absolute 0.6 0 - 0 x10E3/uL   EOS (ABSOLUTE) 0.2 0.0 - 0.4 x10E3/uL   Basophils Absolute 0.0 0 - 0 x10E3/uL   Immature Granulocytes 0 Not Estab. %   Immature Grans (Abs) 0.0 0.0 - 0.1 x10E3/uL      Assessment & Plan:   Problem List Items Addressed This Visit      Cardiovascular and Mediastinum   Hypertension    Likely over-controlled hypertension, when taking amlodipine 10mg  and HCTZ 12.5mg  having swelling and BP readings 98-110/54-60's.  Has stopped both medications and BP is running 130's/80's.  Discussed stopping amlodipine and restarting HCTZ 12.5mg , to ensure is staying hydrated, and to recheck BP daily over the next 2 weeks.  Complications: Overweight  Plan: 1. STOP Amlodipine and BEGIN HCTZ 12.5mg  daily 2. Obtain labs today  3. Encouraged heart healthy diet and increasing exercise to 30 minutes most days of the week, going no more than 2 days in a row without exercise. 4. Check BP 1-2 x per week at home, keep log, and bring to clinic at next appointment. 5. Follow up 2 weeks.         Relevant Medications   hydrochlorothiazide (HYDRODIURIL) 12.5 MG tablet   Other Relevant Orders   CBC with Differential   COMPLETE METABOLIC PANEL WITH GFR   CBC with Differential   COMPLETE METABOLIC PANEL WITH GFR     Hematopoietic and Hemostatic   Thrombocytosis (HCC)    Status unknown.  Recheck labs.  Followup after labs.       Relevant Orders   CBC with Differential     Other   Iron deficiency anemia    Status unknown.  Recheck labs.  Followup after labs.       Relevant Orders   CBC with Differential   Annual physical exam - Primary    Annual physical exam without new findings.  Well adult with no acute concerns.  Plan: 1. Obtain  health maintenance screenings as above according to age. - Increase physical activity to 30 minutes most days of the week.  - Eat healthy diet high in vegetables and fruits; low in refined carbohydrates. - Screening labs and tests as ordered 2. Return 1 year for annual physical.        Other Visit Diagnoses    Long-term use of high-risk medication       Relevant Orders   TSH + free T4   Weight gain       Relevant Orders   Lipid Profile      Meds ordered this encounter  Medications  . hydrochlorothiazide (HYDRODIURIL) 12.5 MG tablet    Sig: Take 1 tablet (12.5 mg total) by mouth daily.    Dispense:  90 tablet    Refill:  1    Follow up plan: Return in about 2 weeks (around 03/31/2020) for HTN F/U.  Harlin Rain, FNP-C Family Nurse Practitioner [facility] 03/17/2020, 1:29 PM

## 2020-03-17 NOTE — Addendum Note (Signed)
Addended by: Wilson Singer on: 03/17/2020 04:54 PM   Modules accepted: Orders

## 2020-03-18 ENCOUNTER — Encounter: Payer: Self-pay | Admitting: Family Medicine

## 2020-03-18 LAB — URINALYSIS, ROUTINE W REFLEX MICROSCOPIC
Bilirubin Urine: NEGATIVE
Glucose, UA: NEGATIVE
Hgb urine dipstick: NEGATIVE
Leukocytes,Ua: NEGATIVE
Nitrite: NEGATIVE
Protein, ur: NEGATIVE
Specific Gravity, Urine: 1.016 (ref 1.001–1.03)
pH: 6 (ref 5.0–8.0)

## 2020-03-18 LAB — CBC WITH DIFFERENTIAL/PLATELET
Absolute Monocytes: 525 cells/uL (ref 200–950)
Basophils Absolute: 57 cells/uL (ref 0–200)
Basophils Relative: 0.8 %
Eosinophils Absolute: 362 cells/uL (ref 15–500)
Eosinophils Relative: 5.1 %
HCT: 37.6 % (ref 35.0–45.0)
Hemoglobin: 12.1 g/dL (ref 11.7–15.5)
Lymphs Abs: 1853 cells/uL (ref 850–3900)
MCH: 27.5 pg (ref 27.0–33.0)
MCHC: 32.2 g/dL (ref 32.0–36.0)
MCV: 85.5 fL (ref 80.0–100.0)
MPV: 8.3 fL (ref 7.5–12.5)
Monocytes Relative: 7.4 %
Neutro Abs: 4303 cells/uL (ref 1500–7800)
Neutrophils Relative %: 60.6 %
Platelets: 451 10*3/uL — ABNORMAL HIGH (ref 140–400)
RBC: 4.4 10*6/uL (ref 3.80–5.10)
RDW: 13.6 % (ref 11.0–15.0)
Total Lymphocyte: 26.1 %
WBC: 7.1 10*3/uL (ref 3.8–10.8)

## 2020-03-18 LAB — COMPLETE METABOLIC PANEL WITH GFR
AG Ratio: 1.4 (calc) (ref 1.0–2.5)
ALT: 21 U/L (ref 6–29)
AST: 20 U/L (ref 10–35)
Albumin: 4.2 g/dL (ref 3.6–5.1)
Alkaline phosphatase (APISO): 55 U/L (ref 31–125)
BUN: 11 mg/dL (ref 7–25)
CO2: 28 mmol/L (ref 20–32)
Calcium: 9.2 mg/dL (ref 8.6–10.2)
Chloride: 99 mmol/L (ref 98–110)
Creat: 1.01 mg/dL (ref 0.50–1.10)
GFR, Est African American: 77 mL/min/{1.73_m2} (ref >=60)
GFR, Est Non African American: 66 mL/min/{1.73_m2} (ref >=60)
Globulin: 3.1 g/dL (calc) (ref 1.9–3.7)
Glucose, Bld: 88 mg/dL (ref 65–99)
Potassium: 3.7 mmol/L (ref 3.5–5.3)
Sodium: 136 mmol/L (ref 135–146)
Total Bilirubin: 1.3 mg/dL — ABNORMAL HIGH (ref 0.2–1.2)
Total Protein: 7.3 g/dL (ref 6.1–8.1)

## 2020-03-18 LAB — LIPID PANEL
Cholesterol: 214 mg/dL — ABNORMAL HIGH (ref <200)
HDL: 124 mg/dL (ref >=50)
LDL Cholesterol (Calc): 72 mg/dL (calc)
Non-HDL Cholesterol (Calc): 90 mg/dL (calc) (ref <130)
Total CHOL/HDL Ratio: 1.7 (calc) (ref <5.0)
Triglycerides: 97 mg/dL (ref <150)

## 2020-03-18 LAB — TSH+FREE T4: TSH W/REFLEX TO FT4: 0.7 mIU/L

## 2020-03-31 ENCOUNTER — Encounter: Payer: Self-pay | Admitting: Family Medicine

## 2020-04-03 ENCOUNTER — Other Ambulatory Visit: Payer: Self-pay | Admitting: Family Medicine

## 2020-04-03 DIAGNOSIS — I10 Essential (primary) hypertension: Secondary | ICD-10-CM

## 2020-04-03 MED ORDER — AMLODIPINE BESYLATE 10 MG PO TABS: 10.0000 mg | ORAL_TABLET | Freq: Every day | ORAL | 1 refills | Status: DC

## 2020-04-07 ENCOUNTER — Telehealth: Payer: BC Managed Care – PPO | Admitting: Family Medicine

## 2020-06-05 ENCOUNTER — Encounter: Payer: Self-pay | Admitting: Family Medicine

## 2020-08-29 DIAGNOSIS — H524 Presbyopia: Secondary | ICD-10-CM | POA: Diagnosis not present

## 2021-01-03 ENCOUNTER — Encounter: Payer: Self-pay | Admitting: Internal Medicine

## 2021-01-04 DIAGNOSIS — Z03818 Encounter for observation for suspected exposure to other biological agents ruled out: Secondary | ICD-10-CM | POA: Diagnosis not present

## 2021-01-04 DIAGNOSIS — Z20822 Contact with and (suspected) exposure to covid-19: Secondary | ICD-10-CM | POA: Diagnosis not present

## 2021-02-12 ENCOUNTER — Other Ambulatory Visit: Payer: Self-pay | Admitting: Internal Medicine

## 2021-02-12 DIAGNOSIS — Z1231 Encounter for screening mammogram for malignant neoplasm of breast: Secondary | ICD-10-CM

## 2021-02-22 ENCOUNTER — Other Ambulatory Visit: Payer: Self-pay

## 2021-02-22 ENCOUNTER — Ambulatory Visit
Admission: RE | Admit: 2021-02-22 | Discharge: 2021-02-22 | Disposition: A | Discharge status: Home / Self Care | Payer: BC Managed Care – PPO | Source: Ambulatory Visit | Attending: Internal Medicine | Admitting: Internal Medicine

## 2021-02-22 DIAGNOSIS — Z1231 Encounter for screening mammogram for malignant neoplasm of breast: Secondary | ICD-10-CM

## 2021-03-22 ENCOUNTER — Encounter: Payer: Self-pay | Admitting: Internal Medicine

## 2021-03-22 ENCOUNTER — Telehealth: Payer: Self-pay

## 2021-03-22 ENCOUNTER — Ambulatory Visit (INDEPENDENT_AMBULATORY_CARE_PROVIDER_SITE_OTHER): Payer: BC Managed Care – PPO | Admitting: Internal Medicine

## 2021-03-22 ENCOUNTER — Other Ambulatory Visit: Payer: Self-pay

## 2021-03-22 VITALS — BP 126/88 | HR 80 | Temp 97.7°F | Resp 17 | Ht 64.0 in | Wt 174.0 lb

## 2021-03-22 DIAGNOSIS — K219 Gastro-esophageal reflux disease without esophagitis: Secondary | ICD-10-CM

## 2021-03-22 DIAGNOSIS — D508 Other iron deficiency anemias: Secondary | ICD-10-CM

## 2021-03-22 DIAGNOSIS — E663 Overweight: Secondary | ICD-10-CM

## 2021-03-22 DIAGNOSIS — I10 Essential (primary) hypertension: Secondary | ICD-10-CM | POA: Diagnosis not present

## 2021-03-22 DIAGNOSIS — Z1211 Encounter for screening for malignant neoplasm of colon: Secondary | ICD-10-CM

## 2021-03-22 DIAGNOSIS — Z0001 Encounter for general adult medical examination with abnormal findings: Secondary | ICD-10-CM | POA: Diagnosis not present

## 2021-03-22 DIAGNOSIS — D473 Essential (hemorrhagic) thrombocythemia: Secondary | ICD-10-CM

## 2021-03-22 DIAGNOSIS — Z6829 Body mass index (BMI) 29.0-29.9, adult: Secondary | ICD-10-CM

## 2021-03-22 DIAGNOSIS — Z1159 Encounter for screening for other viral diseases: Secondary | ICD-10-CM | POA: Diagnosis not present

## 2021-03-22 DIAGNOSIS — E6609 Other obesity due to excess calories: Secondary | ICD-10-CM | POA: Insufficient documentation

## 2021-03-22 DIAGNOSIS — Z3041 Encounter for surveillance of contraceptive pills: Secondary | ICD-10-CM

## 2021-03-22 DIAGNOSIS — Z23 Encounter for immunization: Secondary | ICD-10-CM

## 2021-03-22 DIAGNOSIS — R7309 Other abnormal glucose: Secondary | ICD-10-CM | POA: Diagnosis not present

## 2021-03-22 DIAGNOSIS — Z136 Encounter for screening for cardiovascular disorders: Secondary | ICD-10-CM | POA: Diagnosis not present

## 2021-03-22 DIAGNOSIS — E66811 Obesity, class 1: Secondary | ICD-10-CM | POA: Insufficient documentation

## 2021-03-22 MED ORDER — AMLODIPINE BESYLATE 10 MG PO TABS: 10.0000 mg | ORAL_TABLET | ORAL | 2 refills | Status: DC

## 2021-03-22 MED ORDER — HYDROCHLOROTHIAZIDE 12.5 MG PO TABS: 12.5000 mg | ORAL_TABLET | ORAL | 2 refills | Status: DC

## 2021-03-22 MED ORDER — ALYACEN 1/35 1-35 MG-MCG PO TABS: 1.0000 | ORAL_TABLET | Freq: Every day | ORAL | 12 refills | Status: DC

## 2021-03-22 NOTE — Telephone Encounter (Signed)
Called patient no answer letter sent

## 2021-03-22 NOTE — Assessment & Plan Note (Signed)
Encouraged diet and exercise for weight loss ?

## 2021-03-22 NOTE — Telephone Encounter (Signed)
CALLED PATIENT NO ANSWER LEFT VOICEMAIL FOR A CALL BACK ? ?

## 2021-03-22 NOTE — Patient Instructions (Signed)
Health Maintenance, Female Adopting a healthy lifestyle and getting preventive care are important in promoting health and wellness. Ask your health care provider about: The right schedule for you to have regular tests and exams. Things you can do on your own to prevent diseases and keep yourself healthy. What should I know about diet, weight, and exercise? Eat a healthy diet  Eat a diet that includes plenty of vegetables, fruits, low-fat dairy products, and lean protein. Do not eat a lot of foods that are high in solid fats, added sugars, or sodium. Maintain a healthy weight Body mass index (BMI) is used to identify weight problems. It estimates body fat based on height and weight. Your health care provider can help determine your BMI and help you achieve or maintain a healthy weight. Get regular exercise Get regular exercise. This is one of the most important things you can do for your health. Most adults should: Exercise for at least 150 minutes each week. The exercise should increase your heart rate and make you sweat (moderate-intensity exercise). Do strengthening exercises at least twice a week. This is in addition to the moderate-intensity exercise. Spend less time sitting. Even light physical activity can be beneficial. Watch cholesterol and blood lipids Have your blood tested for lipids and cholesterol at 48 years of age, then have this test every 5 years. Have your cholesterol levels checked more often if: Your lipid or cholesterol levels are high. You are older than 48 years of age. You are at high risk for heart disease. What should I know about cancer screening? Depending on your health history and family history, you may need to have cancer screening at various ages. This may include screening for: Breast cancer. Cervical cancer. Colorectal cancer. Skin cancer. Lung cancer. What should I know about heart disease, diabetes, and high blood pressure? Blood pressure and heart  disease High blood pressure causes heart disease and increases the risk of stroke. This is more likely to develop in people who have high blood pressure readings, are of African descent, or are overweight. Have your blood pressure checked: Every 3-5 years if you are 18-39 years of age. Every year if you are 40 years old or older. Diabetes Have regular diabetes screenings. This checks your fasting blood sugar level. Have the screening done: Once every three years after age 40 if you are at a normal weight and have a low risk for diabetes. More often and at a younger age if you are overweight or have a high risk for diabetes. What should I know about preventing infection? Hepatitis B If you have a higher risk for hepatitis B, you should be screened for this virus. Talk with your health care provider to find out if you are at risk for hepatitis B infection. Hepatitis C Testing is recommended for: Everyone born from 1945 through 1965. Anyone with known risk factors for hepatitis C. Sexually transmitted infections (STIs) Get screened for STIs, including gonorrhea and chlamydia, if: You are sexually active and are younger than 48 years of age. You are older than 48 years of age and your health care provider tells you that you are at risk for this type of infection. Your sexual activity has changed since you were last screened, and you are at increased risk for chlamydia or gonorrhea. Ask your health care provider if you are at risk. Ask your health care provider about whether you are at high risk for HIV. Your health care provider may recommend a prescription medicine   to help prevent HIV infection. If you choose to take medicine to prevent HIV, you should first get tested for HIV. You should then be tested every 3 months for as long as you are taking the medicine. Pregnancy If you are about to stop having your period (premenopausal) and you may become pregnant, seek counseling before you get  pregnant. Take 400 to 800 micrograms (mcg) of folic acid every day if you become pregnant. Ask for birth control (contraception) if you want to prevent pregnancy. Osteoporosis and menopause Osteoporosis is a disease in which the bones lose minerals and strength with aging. This can result in bone fractures. If you are 65 years old or older, or if you are at risk for osteoporosis and fractures, ask your health care provider if you should: Be screened for bone loss. Take a calcium or vitamin D supplement to lower your risk of fractures. Be given hormone replacement therapy (HRT) to treat symptoms of menopause. Follow these instructions at home: Lifestyle Do not use any products that contain nicotine or tobacco, such as cigarettes, e-cigarettes, and chewing tobacco. If you need help quitting, ask your health care provider. Do not use street drugs. Do not share needles. Ask your health care provider for help if you need support or information about quitting drugs. Alcohol use Do not drink alcohol if: Your health care provider tells you not to drink. You are pregnant, may be pregnant, or are planning to become pregnant. If you drink alcohol: Limit how much you use to 0-1 drink a day. Limit intake if you are breastfeeding. Be aware of how much alcohol is in your drink. In the [place] one drink equals one 12 oz bottle of beer (355 mL), one 5 oz glass of wine (148 mL), or one 1 oz glass of hard liquor (44 mL). General instructions Schedule regular health, dental, and eye exams. Stay current with your vaccines. Tell your health care provider if: You often feel depressed. You have ever been abused or do not feel safe at home. Summary Adopting a healthy lifestyle and getting preventive care are important in promoting health and wellness. Follow your health care provider's instructions about healthy diet, exercising, and getting tested or screened for diseases. Follow your health care provider's  instructions on monitoring your cholesterol and blood pressure. This information is not intended to replace advice given to you by your health care provider. Make sure you discuss any questions you have with your health care provider. Document Revised: 08/18/2020 Document Reviewed: 06/03/2018 Elsevier Patient Education  2022 [employer]  

## 2021-03-22 NOTE — Assessment & Plan Note (Signed)
CBC today Continue oral iron 

## 2021-03-22 NOTE — Progress Notes (Signed)
Subjective:    Patient ID: Madison Conway, female    DOB: 1971/06/17, 50 y.o.   MRN: [id number]  HPI  Pt presents to the clinic today for her annual exam. She is also due to follow up chronic conditions.  HTN: her BP today is 135/93. She is taking Amlodipine and HCTZ as prescribed. There is no ECG on file.  Iron Deficiency Anemia: Her last H/H was 12.1/37.6, 02/2020. She is taking oral iron as prescribed. She does not follow with hematology.  GERD: Triggered by acidic foods. She takes Famotidine as needed with good results. She has never had an upper GI.  Flu: 04/2020 Tetanus: 11/2015 Covid: Moderna x 3 Pap Smear: 09/2016 Mammogram: 02/2021 Colon Screening: > 5 years ago Vision Screening: every 2 years Dentist: biannually  Diet: She does eat some meat. She consumes veggies more than fruit. She rarely eats fried foods. She drinks mostly water. Exercise: Walking  Review of Systems     Past Medical History:  Diagnosis Date   Asthma    childhood.    Current Outpatient Medications  Medication Sig Dispense Refill   amLODipine (NORVASC) 10 MG tablet Take 1 tablet (10 mg total) by mouth daily. (Patient taking differently: Take 10 mg by mouth 3 (three) times a week.) 30 tablet 1   famotidine (PEPCID) 10 MG tablet Take 10 mg by mouth daily as needed for heartburn or indigestion.     ferrous sulfate (FER-IN-SOL) 75 (15 Fe) MG/ML SOLN Take 15 mg of iron by mouth 3 (three) times a week.      hydrochlorothiazide (HYDRODIURIL) 12.5 MG tablet Take 1 tablet (12.5 mg total) by mouth daily. (Patient taking differently: Take 12.5 mg by mouth 3 (three) times a week.) 90 tablet 1   Multiple Vitamins-Minerals (MULTIVITAMIN ADULT PO) Take 1 tablet by mouth every other day.      norethindrone-ethinyl estradiol 1/35 (ALAYCEN 1/35) tablet Take 1 tablet by mouth daily. 28 tablet 12   No current facility-administered medications for this visit.    No Known Allergies  Family History  Problem  Relation Age of Onset   Diabetes Maternal Grandmother    Breast cancer Neg Hx    Colon cancer Neg Hx    Ovarian cancer Neg Hx     Social History   Socioeconomic History   Marital status: Single    Spouse name: Not on file   Number of children: Not on file   Years of education: Not on file   Highest education level: Not on file  Occupational History   Not on file  Tobacco Use   Smoking status: Never   Smokeless tobacco: Never  Vaping Use   Vaping Use: Never used  Substance and Sexual Activity   Alcohol use: Yes    Alcohol/week: 2.0 - 3.0 standard drinks    Types: 2 - 3 Standard drinks or equivalent per week   Drug use: No   Sexual activity: Yes    Birth control/protection: Pill  Other Topics Concern   Not on file  Social History Narrative   Not on file   Social Determinants of Health   Financial Resource Strain: Not on file  Food Insecurity: Not on file  Transportation Needs: Not on file  Physical Activity: Not on file  Stress: Not on file  Social Connections: Not on file  Intimate Partner Violence: Not on file     Constitutional: Denies fever, malaise, fatigue, headache or abrupt weight changes.  HEENT: Denies eye pain,  eye redness, ear pain, ringing in the ears, wax buildup, runny nose, nasal congestion, bloody nose, or sore throat. Respiratory: Denies difficulty breathing, shortness of breath, cough or sputum production.   Cardiovascular: Denies chest pain, chest tightness, palpitations or swelling in the hands or feet.  Gastrointestinal: Pt reports intermittent reflux. Denies abdominal pain, bloating, constipation, diarrhea or blood in the stool.  GU: Denies urgency, frequency, pain with urination, burning sensation, blood in urine, odor or discharge. Musculoskeletal: Denies decrease in range of motion, difficulty with gait, muscle pain or joint pain and swelling.  Skin: Denies redness, rashes, lesions or ulcercations.  Neurological: Denies dizziness,  difficulty with memory, difficulty with speech or problems with balance and coordination.  Psych: Denies anxiety, depression, SI/HI.  No other specific complaints in a complete review of systems (except as listed in HPI above).  Objective:   Physical Exam  BP (!) 135/93 (BP Location: Right Arm, Patient Position: Sitting, Cuff Size: Normal)   Pulse 80   Temp 97.7 F (36.5 C) (Temporal)   Resp 17   Ht 5\' 4"  (1.626 m)   Wt 174 lb (78.9 kg)   LMP 03/01/2021   SpO2 100%   BMI 29.87 kg/m   Wt Readings from Last 3 Encounters:  03/17/20 173 lb 12.8 oz (78.8 kg)  03/17/19 172 lb 9.6 oz (78.3 kg)  07/10/18 164 lb 12.8 oz (74.8 kg)    General: Appears her stated age, overweight, in NAD. Skin: Warm, dry and intact. No rashes noted. HEENT: Head: normal shape and size; Eyes: sclera white and EOMs intact;  Neck:  Neck supple, trachea midline. No masses, lumps or thyromegaly present.  Cardiovascular: Normal rate and rhythm. S1,S2 noted.  No murmur, rubs or gallops noted. No JVD or BLE edema.  Pulmonary/Chest: Normal effort and positive vesicular breath sounds. No respiratory distress. No wheezes, rales or ronchi noted.  Abdomen: Soft and nontender. Normal bowel sounds. No distention or masses noted. Liver, spleen and kidneys non palpable. Musculoskeletal: Strength 5/5 BUE/BLE. No difficulty with gait.  Neurological: Alert and oriented. Cranial nerves II-XII grossly intact. Coordination normal.  Psychiatric: Mood and affect normal. Behavior is normal. Judgment and thought content normal.    BMET    Component Value Date/Time   NA 136 03/17/2020 1007   NA 138 07/09/2016 1448   K 3.7 03/17/2020 1007   CL 99 03/17/2020 1007   CO2 28 03/17/2020 1007   GLUCOSE 88 03/17/2020 1007   BUN 11 03/17/2020 1007   BUN 8 03/04/2017 0000   CREATININE 1.01 03/17/2020 1007   CALCIUM 9.2 03/17/2020 1007   GFRNONAA 66 03/17/2020 1007   GFRAA 77 03/17/2020 1007    Lipid Panel     Component Value  Date/Time   CHOL 214 (H) 03/17/2020 1007   CHOL 195 02/28/2016 0000   TRIG 97 03/17/2020 1007   TRIG 86 02/28/2016 0000   HDL 124 03/17/2020 1007   CHOLHDL 1.7 03/17/2020 1007   LDLCALC 72 03/17/2020 1007    CBC    Component Value Date/Time   WBC 7.1 03/17/2020 1007   RBC 4.40 03/17/2020 1007   HGB 12.1 03/17/2020 1007   HGB 10.9 (L) 03/29/2019 1536   HCT 37.6 03/17/2020 1007   HCT 33.5 (L) 03/29/2019 1536   PLT 451 (H) 03/17/2020 1007   PLT 447 03/29/2019 1536   MCV 85.5 03/17/2020 1007   MCV 83 03/29/2019 1536   MCH 27.5 03/17/2020 1007   MCHC 32.2 03/17/2020 1007   RDW  13.6 03/17/2020 1007   RDW 13.9 03/29/2019 1536   LYMPHSABS 1,853 03/17/2020 1007   LYMPHSABS 2.2 03/29/2019 1536   MONOABS 0.6 04/13/2018 1106   EOSABS 362 03/17/2020 1007   EOSABS 0.2 03/29/2019 1536   BASOSABS 57 03/17/2020 1007   BASOSABS 0.0 03/29/2019 1536    Hgb A1C No results found for: HGBA1C          Assessment & Plan:   Preventative Health Maintenance:  Flu shot today Tetanus UTD Encouraged her to get her Covid booster Pap smear due 2023 Mammogram UTD Referral to GI for screening colonoscopy Encouraged her to consume a balanced diet and exercise regimen Will check CBC, CMET, Lipid, A1C and Hep C today  RTC in  1 year, sooner if needed Webb Silversmith, NP This visit occurred during the SARS-CoV-2 public health emergency.  Safety protocols were in place, including screening questions prior to the visit, additional usage of staff PPE, and extensive cleaning of exam room while observing appropriate contact time as indicated for disinfecting solutions.

## 2021-03-22 NOTE — Assessment & Plan Note (Signed)
Avoid foods that trigger your reflux Encouraged weight loss as this can help reduce reflux symptoms Continue Famotidine as needed

## 2021-03-22 NOTE — Assessment & Plan Note (Signed)
Borderline control on Amlodipine and HCTZ 3 x week Unable to take more frequently secondary to lower extremity pain Reinforced DASH diet and exercise for weight loss CMET today Will monitor

## 2021-03-22 NOTE — Progress Notes (Signed)
Letter sent no answer

## 2021-03-23 ENCOUNTER — Encounter: Payer: BC Managed Care – PPO | Admitting: Family Medicine

## 2021-03-23 LAB — HEPATITIS C ANTIBODY
Hepatitis C Ab: NONREACTIVE
SIGNAL TO CUT-OFF: 0.01 (ref <1.00)

## 2021-03-23 LAB — COMPLETE METABOLIC PANEL WITH GFR
AG Ratio: 1.2 (calc) (ref 1.0–2.5)
ALT: 22 U/L (ref 6–29)
AST: 19 U/L (ref 10–35)
Albumin: 3.9 g/dL (ref 3.6–5.1)
Alkaline phosphatase (APISO): 65 U/L (ref 31–125)
BUN/Creatinine Ratio: 12 (calc) (ref 6–22)
BUN: 12 mg/dL (ref 7–25)
CO2: 26 mmol/L (ref 20–32)
Calcium: 9.2 mg/dL (ref 8.6–10.2)
Chloride: 102 mmol/L (ref 98–110)
Creat: 1 mg/dL — ABNORMAL HIGH (ref 0.50–0.99)
Globulin: 3.2 g/dL (calc) (ref 1.9–3.7)
Glucose, Bld: 86 mg/dL (ref 65–99)
Potassium: 4.4 mmol/L (ref 3.5–5.3)
Sodium: 137 mmol/L (ref 135–146)
Total Bilirubin: 0.7 mg/dL (ref 0.2–1.2)
Total Protein: 7.1 g/dL (ref 6.1–8.1)
eGFR: 69 mL/min/{1.73_m2} (ref >=60)

## 2021-03-23 LAB — CBC
HCT: 37.3 % (ref 35.0–45.0)
Hemoglobin: 12 g/dL (ref 11.7–15.5)
MCH: 27.1 pg (ref 27.0–33.0)
MCHC: 32.2 g/dL (ref 32.0–36.0)
MCV: 84.2 fL (ref 80.0–100.0)
MPV: 8.7 fL (ref 7.5–12.5)
Platelets: 567 10*3/uL — ABNORMAL HIGH (ref 140–400)
RBC: 4.43 10*6/uL (ref 3.80–5.10)
RDW: 13.9 % (ref 11.0–15.0)
WBC: 8.8 10*3/uL (ref 3.8–10.8)

## 2021-03-23 LAB — LIPID PANEL
Cholesterol: 219 mg/dL — ABNORMAL HIGH (ref <200)
HDL: 130 mg/dL (ref >=50)
LDL Cholesterol (Calc): 76 mg/dL (calc)
Non-HDL Cholesterol (Calc): 89 mg/dL (calc) (ref <130)
Total CHOL/HDL Ratio: 1.7 (calc) (ref <5.0)
Triglycerides: 57 mg/dL (ref <150)

## 2021-03-23 LAB — HEMOGLOBIN A1C
Hgb A1c MFr Bld: 5 % of total Hgb (ref <5.7)
Mean Plasma Glucose: 97 mg/dL
eAG (mmol/L): 5.4 mmol/L

## 2021-03-28 ENCOUNTER — Telehealth: Payer: Self-pay

## 2021-03-28 NOTE — Telephone Encounter (Signed)
Pt. Calling to schedule colonoscopy. She is requesting a Friday if possible anytime in November or December. She is requesting a call back to schedule

## 2021-03-28 NOTE — Telephone Encounter (Signed)
Called patient no answer left voicemail for a call back

## 2021-04-02 ENCOUNTER — Telehealth: Payer: Self-pay

## 2021-04-02 NOTE — Telephone Encounter (Signed)
Pt. Calling to schedule a colonoscopy

## 2021-04-05 ENCOUNTER — Other Ambulatory Visit: Payer: Self-pay

## 2021-04-05 DIAGNOSIS — Z1211 Encounter for screening for malignant neoplasm of colon: Secondary | ICD-10-CM

## 2021-04-05 MED ORDER — NA SULFATE-K SULFATE-MG SULF 17.5-3.13-1.6 GM/177ML PO SOLN: 1.0000 | Freq: Once | ORAL | 0 refills | Status: AC

## 2021-04-05 NOTE — Telephone Encounter (Signed)
Patient does not need a appointment the guidelines are 68 and over. Please call and scheduled the procedure

## 2021-04-05 NOTE — Progress Notes (Signed)
Gastroenterology Pre-Procedure Review  Request Date: 05/10/21 Requesting Physician: Dr. Bonna Gains  PATIENT REVIEW QUESTIONS: The patient responded to the following health history questions as indicated:    1. Are you having any GI issues? no 2. Do you have a personal history of Polyps? no 3. Do you have a family history of Colon Cancer or Polyps? no 4. Diabetes Mellitus? no 5. Joint replacements in the past 12 months?no 6. Major health problems in the past 3 months?no 7. Any artificial heart valves, MVP, or defibrillator?no    MEDICATIONS & ALLERGIES:    Patient reports the following regarding taking any anticoagulation/antiplatelet therapy:   Plavix, Coumadin, Eliquis, Xarelto, Lovenox, Pradaxa, Brilinta, or Effient? no Aspirin? no  Patient confirms/reports the following medications:  Current Outpatient Medications  Medication Sig Dispense Refill   amLODipine (NORVASC) 10 MG tablet Take 1 tablet (10 mg total) by mouth 3 (three) times a week. 90 tablet 2   famotidine (PEPCID) 10 MG tablet Take 10 mg by mouth daily as needed for heartburn or indigestion.     ferrous sulfate (FER-IN-SOL) 75 (15 Fe) MG/ML SOLN Take 15 mg of iron by mouth 3 (three) times a week.      hydrochlorothiazide (HYDRODIURIL) 12.5 MG tablet Take 1 tablet (12.5 mg total) by mouth 3 (three) times a week. 90 tablet 2   Multiple Vitamins-Minerals (MULTIVITAMIN ADULT PO) Take 1 tablet by mouth every other day.      norethindrone-ethinyl estradiol 1/35 (ALAYCEN 1/35) tablet Take 1 tablet by mouth daily. 28 tablet 12   No current facility-administered medications for this visit.    Patient confirms/reports the following allergies:  No Known Allergies  No orders of the defined types were placed in this encounter.   AUTHORIZATION INFORMATION Primary Insurance: 1D#: Group #:  Secondary Insurance: 1D#: Group #:  SCHEDULE INFORMATION: Date: 05/10/21 Time: Location: [place]

## 2021-04-05 NOTE — Telephone Encounter (Signed)
Procedure scheduled for 05/10/21.

## 2021-05-10 ENCOUNTER — Ambulatory Visit: Payer: BC Managed Care – PPO | Admitting: Certified Registered"

## 2021-05-10 ENCOUNTER — Ambulatory Visit
Admission: RE | Admit: 2021-05-10 | Discharge: 2021-05-10 | Disposition: A | Discharge status: Home / Self Care | Payer: BC Managed Care – PPO | Attending: Gastroenterology | Admitting: Gastroenterology

## 2021-05-10 ENCOUNTER — Encounter
Admission: RE | Disposition: A | Discharge status: Home / Self Care | Payer: Self-pay | Source: Home / Self Care | Attending: Gastroenterology

## 2021-05-10 ENCOUNTER — Other Ambulatory Visit: Payer: Self-pay

## 2021-05-10 ENCOUNTER — Encounter: Payer: Self-pay | Admitting: Gastroenterology

## 2021-05-10 DIAGNOSIS — K219 Gastro-esophageal reflux disease without esophagitis: Secondary | ICD-10-CM | POA: Diagnosis not present

## 2021-05-10 DIAGNOSIS — J45909 Unspecified asthma, uncomplicated: Secondary | ICD-10-CM | POA: Diagnosis not present

## 2021-05-10 DIAGNOSIS — Z79899 Other long term (current) drug therapy: Secondary | ICD-10-CM | POA: Insufficient documentation

## 2021-05-10 DIAGNOSIS — D649 Anemia, unspecified: Secondary | ICD-10-CM | POA: Insufficient documentation

## 2021-05-10 DIAGNOSIS — K639 Disease of intestine, unspecified: Secondary | ICD-10-CM | POA: Diagnosis not present

## 2021-05-10 DIAGNOSIS — I10 Essential (primary) hypertension: Secondary | ICD-10-CM | POA: Diagnosis not present

## 2021-05-10 DIAGNOSIS — K529 Noninfective gastroenteritis and colitis, unspecified: Secondary | ICD-10-CM | POA: Insufficient documentation

## 2021-05-10 DIAGNOSIS — L539 Erythematous condition, unspecified: Secondary | ICD-10-CM | POA: Diagnosis not present

## 2021-05-10 DIAGNOSIS — Z1211 Encounter for screening for malignant neoplasm of colon: Secondary | ICD-10-CM | POA: Insufficient documentation

## 2021-05-10 HISTORY — PX: COLONOSCOPY WITH PROPOFOL: SHX5780

## 2021-05-10 LAB — POCT PREGNANCY, URINE: Preg Test, Ur: NEGATIVE

## 2021-05-10 SURGERY — COLONOSCOPY WITH PROPOFOL
Anesthesia: General

## 2021-05-10 MED ORDER — SODIUM CHLORIDE 0.9 % IV SOLN: INTRAVENOUS | Status: DC

## 2021-05-10 MED ORDER — PROPOFOL 500 MG/50ML IV EMUL
INTRAVENOUS | Status: DC | PRN
  Administered 2021-05-10: 175 ug/kg/min via INTRAVENOUS

## 2021-05-10 MED ORDER — PROPOFOL 10 MG/ML IV BOLUS
INTRAVENOUS | Status: DC | PRN
  Administered 2021-05-10: 70 mg via INTRAVENOUS

## 2021-05-10 MED ORDER — PROPOFOL 10 MG/ML IV BOLUS
INTRAVENOUS | Status: AC
  Filled 2021-05-10: qty 40

## 2021-05-10 NOTE — Anesthesia Postprocedure Evaluation (Signed)
Anesthesia Post Note  Patient: Madison Conway  Procedure(s) Performed: COLONOSCOPY WITH PROPOFOL  Patient location during evaluation: Phase II Anesthesia Type: General Level of consciousness: awake and alert, awake and oriented Pain management: pain level controlled Vital Signs Assessment: post-procedure vital signs reviewed and stable Respiratory status: spontaneous breathing, nonlabored ventilation and respiratory function stable Cardiovascular status: blood pressure returned to baseline and stable Postop Assessment: no apparent nausea or vomiting Anesthetic complications: no   No notable events documented.   Last Vitals:  Vitals:   05/10/21 1110 05/10/21 1120  BP: 132/88 (!) 145/86  Pulse: 65 64  Resp: (!) 22 19  Temp:    SpO2: 100% 97%    Last Pain:  Vitals:   05/10/21 1120  TempSrc:   PainSc: 0-No pain                 Phill Mutter

## 2021-05-10 NOTE — Transfer of Care (Signed)
Immediate Anesthesia Transfer of Care Note  Patient: Madison Conway  Procedure(s) Performed: COLONOSCOPY WITH PROPOFOL  Patient Location: Endoscopy Unit  Anesthesia Type:General  Level of Consciousness: awake, alert  and oriented  Airway & Oxygen Therapy: Patient Spontanous Breathing  Post-op Assessment: Report given to RN and Post -op Vital signs reviewed and stable  Post vital signs: Reviewed and stable  Last Vitals:  Vitals Value Taken Time  BP    Temp    Pulse    Resp    SpO2      Last Pain:  Vitals:   05/10/21 0922  TempSrc: Temporal  PainSc: 0-No pain         Complications: No notable events documented.

## 2021-05-10 NOTE — Anesthesia Preprocedure Evaluation (Signed)
Anesthesia Evaluation  Patient identified by MRN, date of birth, ID band Patient awake    Reviewed: Allergy & Precautions, H&P , NPO status , Patient's Chart, lab work & pertinent test results  Airway Mallampati: II  TM Distance: >3 FB Neck ROM: Full    Dental no notable dental hx.    Pulmonary asthma ,    Pulmonary exam normal        Cardiovascular hypertension, Pt. on medications Normal cardiovascular exam     Neuro/Psych negative neurological ROS  negative psych ROS   GI/Hepatic Neg liver ROS, Bowel prep,GERD  Controlled,  Endo/Other  negative endocrine ROS  Renal/GU negative Renal ROS  negative genitourinary   Musculoskeletal negative musculoskeletal ROS (+)   Abdominal   Peds negative pediatric ROS (+)  Hematology negative hematology ROS (+) anemia ,   Anesthesia Other Findings   Reproductive/Obstetrics negative OB ROS                             Anesthesia Physical Anesthesia Plan  ASA: 2  Anesthesia Plan: General   Post-op Pain Management:    Induction: Intravenous  PONV Risk Score and Plan: 2 and Propofol infusion and TIVA  Airway Management Planned: Natural Airway and Nasal Cannula  Additional Equipment:   Intra-op Plan:   Post-operative Plan:   Informed Consent: I have reviewed the patients History and Physical, chart, labs and discussed the procedure including the risks, benefits and alternatives for the proposed anesthesia with the patient or authorized representative who has indicated his/her understanding and acceptance.       Plan Discussed with: CRNA, Anesthesiologist and Surgeon  Anesthesia Plan Comments:         Anesthesia Quick Evaluation

## 2021-05-10 NOTE — H&P (Signed)
Madison Antigua, MD [address] [place], [place], [place], [address] [id number], [place], [place], [place], [address] Phone: [phone]  Fax: [phone]  Primary Care Physician:  Jearld Fenton, NP   Pre-Procedure History & Physical: HPI:  Madison Conway is a 50 y.o. female is here for a colonoscopy.   Past Medical History:  Diagnosis Date   Asthma    childhood.    Past Surgical History:  Procedure Laterality Date   NO PAST SURGERIES      Prior to Admission medications   Medication Sig Start Date End Date Taking? Authorizing Provider  amLODipine (NORVASC) 10 MG tablet Take 1 tablet (10 mg total) by mouth 3 (three) times a week. 03/23/21  Yes Baity, Coralie Keens, NP  hydrochlorothiazide (HYDRODIURIL) 12.5 MG tablet Take 1 tablet (12.5 mg total) by mouth 3 (three) times a week. 03/23/21  Yes Jearld Fenton, NP  norethindrone-ethinyl estradiol 1/35 (ALAYCEN 1/35) tablet Take 1 tablet by mouth daily. 03/22/21  Yes Jearld Fenton, NP  famotidine (PEPCID) 10 MG tablet Take 10 mg by mouth daily as needed for heartburn or indigestion.    [provider]  ferrous sulfate (FER-IN-SOL) 75 (15 Fe) MG/ML SOLN Take 15 mg of iron by mouth 3 (three) times a week.     [provider]  Multiple Vitamins-Minerals (MULTIVITAMIN ADULT PO) Take 1 tablet by mouth every other day.     [provider]    Allergies as of 04/05/2021   (No Known Allergies)    Family History  Problem Relation Age of Onset   Diabetes Maternal Grandmother    Breast cancer Neg Hx    Colon cancer Neg Hx    Ovarian cancer Neg Hx     Social History   Socioeconomic History   Marital status: Single    Spouse name: Not on file   Number of children: Not on file   Years of education: Not on file   Highest education level: Not on file  Occupational History   Not on file  Tobacco Use   Smoking status: Never   Smokeless tobacco: Never  Vaping Use   Vaping Use: Never used   Substance and Sexual Activity   Alcohol use: Yes    Alcohol/week: 2.0 - 3.0 standard drinks    Types: 2 - 3 Standard drinks or equivalent per week   Drug use: No   Sexual activity: Yes    Birth control/protection: Pill  Other Topics Concern   Not on file  Social History Narrative   Not on file   Social Determinants of Health   Financial Resource Strain: Not on file  Food Insecurity: Not on file  Transportation Needs: Not on file  Physical Activity: Not on file  Stress: Not on file  Social Connections: Not on file  Intimate Partner Violence: Not on file    Review of Systems: See HPI, otherwise negative ROS  Physical Exam: Constitutional: General:   Alert,  Well-developed, well-nourished, pleasant and cooperative in NAD BP (!) 151/91   Pulse 86   Temp (!) 96.3 F (35.7 C) (Temporal)   Resp 18   Ht 5\' 5"  (1.651 m)   Wt 78.9 kg   SpO2 100%   BMI 28.96 kg/m   Head: Normocephalic, atraumatic.   Eyes:  Sclera clear, no icterus.   Conjunctiva pink.   Mouth:  No deformity or lesions, oropharynx pink & moist.  Neck:  Supple, trachea midline  Respiratory: Normal respiratory  effort  Gastrointestinal:  Soft, non-tender and non-distended without masses, hepatosplenomegaly or hernias noted.  No guarding or rebound tenderness.     Cardiac: No clubbing or edema.  No cyanosis. Normal posterior tibial pedal pulses noted.  Lymphatic:  No significant cervical adenopathy.  Psych:  Alert and cooperative. Normal mood and affect.  Musculoskeletal:   Symmetrical without gross deformities. 5/5 Lower extremity strength bilaterally.  Skin: Warm. Intact without significant lesions or rashes. No jaundice.  Neurologic:  Face symmetrical, tongue midline, Normal sensation to touch;  grossly normal neurologically.  Psych:  Alert and oriented x3, Alert and cooperative. Normal mood and affect.  Impression/Plan: Madison Conway is here for a colonoscopy to be performed for average  risk screening.  Risks, benefits, limitations, and alternatives regarding  colonoscopy have been reviewed with the patient.  Questions have been answered.  All parties agreeable.   Virgel Manifold, MD  05/10/2021, 10:25 AM

## 2021-05-10 NOTE — Op Note (Signed)
[facility]astroenter[facility] Patient Name: Madison Conway Procedure Date: 05/10/2021 10:30 AM MRN: [id number] Account #: [id number] Date of Birth: 1971/03/16 Admit Type: Outpatient Age: 50 Room: [facility] ENDO ROOM 2 Gender: Female Note Status: Finalized Instrument Name: Park Meo [id number] Procedure:             Colonoscopy Indications:           Screening for colorectal malignant neoplasm Providers:             Nylah Butkus B. Bonna Gains MD, MD Referring MD:          Jearld Fenton (Referring MD) Medicines:             Monitored Anesthesia Care Complications:         No immediate complications. Procedure:             Pre-Anesthesia Assessment:                        - Prior to the procedure, a History and Physical was                         performed, and patient medications, allergies and                         sensitivities were reviewed. The patient's tolerance                         of previous anesthesia was reviewed.                        - The risks and benefits of the procedure and the                         sedation options and risks were discussed with the                         patient. All questions were answered and informed                         consent was obtained.                        - Patient identification and proposed procedure were                         verified prior to the procedure by the physician, the                         nurse, the anesthesiologist, the anesthetist and the                         technician. The procedure was verified in the                         pre-procedure area in the procedure room in the                         endoscopy suite.                        -  Prophylactic Antibiotics: The patient does not                         require prophylactic antibiotics.                        - ASA Grade Assessment: II - A patient with mild                         systemic disease.                        - After  reviewing the risks and benefits, the patient                         was deemed in satisfactory condition to undergo the                         procedure.                        - Monitored anesthesia care was determined to be                         medically necessary for this procedure based on review                         of the patient's medical history, medications, and                         prior anesthesia history.                        - The anesthesia plan was to use monitored anesthesia                         care (MAC).                        After obtaining informed consent, the colonoscope was                         passed under direct vision. Throughout the procedure,                         the patient's blood pressure, pulse, and oxygen                         saturations were monitored continuously. The                         Colonoscope was introduced through the anus and                         advanced to the the cecum, identified by appendiceal                         orifice and ileocecal valve. The colonoscopy was  performed with ease. The patient tolerated the                         procedure well. The quality of the bowel preparation                         was good. Findings:      The perianal and digital rectal examinations were normal.      Nonbleeding ulcerated mucosa with no stigmata of recent bleeding were       present in the transverse colon. Biopsies were taken with a cold forceps       for histology.      A patchy area of mildly erythematous mucosa was found in the descending       colon and in the transverse colon. Biopsies were taken with a cold       forceps for histology.      The exam was otherwise normal throughout the examined colon.      The retroflexed view of the distal rectum and anal verge was normal and       showed no anal or rectal abnormalities. Impression:            - Mucosal ulceration. Biopsied.                         - Erythematous mucosa in the descending colon and in                         the transverse colon. Biopsied. Recommendation:        - Await pathology results.                        - Avoid NSAIDs except Aspirin if medically indicated                         by PCP                        - Repeat colonoscopy date to be determined after                         pending pathology results are reviewed.                        - Continue present medications.                        - Resume previous diet.                        - The findings and recommendations were discussed with                         the patient.                        - The findings and recommendations were discussed with                         the patient's family. Procedure Code(s):     --- Professional ---  45380, Colonoscopy, flexible; with biopsy, single or                         multiple Diagnosis Code(s):     --- Professional ---                        Z12.11, Encounter for screening for malignant neoplasm                         of colon                        K63.89, Other specified diseases of intestine CPT copyright 2019 American Medical Association. All rights reserved. The codes documented in this report are preliminary and upon coder review may  be revised to meet current compliance requirements.  Vonda Antigua, MD Margretta Sidle B. Bonna Gains MD, MD 05/10/2021 11:04:11 AM This report has been signed electronically. Number of Addenda: 0 Note Initiated On: 05/10/2021 10:30 AM Scope Withdrawal Time: 0 hours 13 minutes 4 seconds  Total Procedure Duration: 0 hours 18 minutes 54 seconds  Estimated Blood Loss:  Estimated blood loss: none.      [facility]

## 2021-05-11 ENCOUNTER — Telehealth: Payer: Self-pay | Admitting: Gastroenterology

## 2021-05-11 ENCOUNTER — Encounter: Payer: Self-pay | Admitting: Gastroenterology

## 2021-05-11 ENCOUNTER — Encounter: Payer: Self-pay | Admitting: Internal Medicine

## 2021-05-11 LAB — SURGICAL PATHOLOGY

## 2021-05-11 NOTE — Telephone Encounter (Signed)
I spoke to pt and she is aware that we are awaiting pathology results to be interpreted... we will contact her once this has been done

## 2021-05-11 NOTE — Telephone Encounter (Signed)
Inbound call from pt requesting a call back stating she needs someone to go over here results from procedure.

## 2021-05-31 ENCOUNTER — Other Ambulatory Visit: Payer: Self-pay | Admitting: Gastroenterology

## 2021-05-31 ENCOUNTER — Encounter: Payer: Self-pay | Admitting: Gastroenterology

## 2021-05-31 ENCOUNTER — Ambulatory Visit (INDEPENDENT_AMBULATORY_CARE_PROVIDER_SITE_OTHER): Payer: BC Managed Care – PPO | Admitting: Gastroenterology

## 2021-05-31 VITALS — BP 159/99 | HR 76 | Temp 98.8°F | Wt 182.0 lb

## 2021-05-31 DIAGNOSIS — K529 Noninfective gastroenteritis and colitis, unspecified: Secondary | ICD-10-CM

## 2021-05-31 NOTE — Progress Notes (Signed)
Madison Antigua, MD [address]  [place]  [place], [place] [address]  Main: [phone]  Fax: [phone]   Primary Care Physician: Jearld Fenton, NP   Chief Complaint  Patient presents with   Follow-up    biopsies showed changes that suggest that she may have underlying inflammatory bowel disease    HPI: WAYLON KOFFLER is a 50 y.o. female recently seen during an outpatient screening colonoscopy and was found to have colon erythema and colon ulcers and was asked to follow-up in clinic to discuss biopsy findings in detail.  Patient states that now she remembers as a child from fifth grade to eighth grade being told that she has colitis and was having abdominal pain and loose stools at the time and was prescribed 6 pills a day at that time.  She took this for years but since symptoms got better, she stopped taking it after about eighth grade.  Since then her symptoms resolved.  She is having 1 formed bowel movement daily without blood.  No abdominal pain.  No nausea or vomiting.  Good appetite.  No weight loss.  No dysphagia or heartburn.  Denies frequent NSAID use.  States will take Aleve about 2 pills 1 or 2 days in the month but no other NSAIDs besides this.  Reports that this most recent colonoscopy was her fourth colonoscopy in her lifetime.  She does not have records from previous colonoscopies.  I checked provation and care everywhere and no previous colonoscopy records were available there either   ROS: All ROS reviewed and negative except as per HPI   Past Medical History:  Diagnosis Date   Asthma    childhood.    Past Surgical History:  Procedure Laterality Date   COLONOSCOPY WITH PROPOFOL N/A 05/10/2021   Procedure: COLONOSCOPY WITH PROPOFOL;  Surgeon: Virgel Manifold, MD;  Location: [facility] ENDOSCOPY;  Service: Endoscopy;  Laterality: N/A;   NO PAST SURGERIES      Prior to Admission medications   Medication Sig Start Date End Date Taking?  Authorizing Provider  amLODipine (NORVASC) 10 MG tablet Take 1 tablet (10 mg total) by mouth 3 (three) times a week. 03/23/21  Yes Jearld Fenton, NP  famotidine (PEPCID) 10 MG tablet Take 10 mg by mouth daily as needed for heartburn or indigestion.   Yes [provider]  ferrous sulfate (FER-IN-SOL) 75 (15 Fe) MG/ML SOLN Take 15 mg of iron by mouth 3 (three) times a week.    Yes [provider]  hydrochlorothiazide (HYDRODIURIL) 12.5 MG tablet Take 1 tablet (12.5 mg total) by mouth 3 (three) times a week. 03/23/21  Yes Jearld Fenton, NP  Multiple Vitamins-Minerals (MULTIVITAMIN ADULT PO) Take 1 tablet by mouth every other day.    Yes [provider]  norethindrone-ethinyl estradiol 1/35 (ALAYCEN 1/35) tablet Take 1 tablet by mouth daily. 03/22/21  Yes Jearld Fenton, NP    Family History  Problem Relation Age of Onset   Diabetes Maternal Grandmother    Breast cancer Neg Hx    Colon cancer Neg Hx    Ovarian cancer Neg Hx      Social History   Tobacco Use   Smoking status: Never   Smokeless tobacco: Never  Vaping Use   Vaping Use: Never used  Substance Use Topics   Alcohol use: Yes    Alcohol/week: 2.0 - 3.0 standard drinks    Types: 2 - 3 Standard drinks or equivalent per week  Drug use: No    Allergies as of 05/31/2021   (No Known Allergies)    Physical Examination:  Constitutional: General:   Alert,  Well-developed, well-nourished, pleasant and cooperative in NAD BP (!) 159/99   Pulse 76   Temp 98.8 F (37.1 C) (Oral)   Wt 182 lb (82.6 kg)   BMI 30.29 kg/m   Respiratory: Normal respiratory effort  Gastrointestinal:  Soft, non-tender and non-distended without masses, hepatosplenomegaly or hernias noted.  No guarding or rebound tenderness.     Cardiac: No clubbing or edema.  No cyanosis. Normal posterior tibial pedal pulses noted.  Psych:  Alert and cooperative. Normal mood and affect.  Musculoskeletal:  Normal gait. Head  normocephalic, atraumatic. Symmetrical without gross deformities. 5/5 Lower extremity strength bilaterally.  Skin: Warm. Intact without significant lesions or rashes. No jaundice.  Neck: Supple, trachea midline  Lymph: No cervical lymphadenopathy  Psych:  Alert and oriented x3, Alert and cooperative. Normal mood and affect.  Labs: CMP     Component Value Date/Time   NA 137 03/22/2021 0829   NA 138 07/09/2016 1448   K 4.4 03/22/2021 0829   CL 102 03/22/2021 0829   CO2 26 03/22/2021 0829   GLUCOSE 86 03/22/2021 0829   BUN 12 03/22/2021 0829   BUN 8 03/04/2017 0000   CREATININE 1.00 (H) 03/22/2021 0829   CALCIUM 9.2 03/22/2021 0829   PROT 7.1 03/22/2021 0829   PROT 7.3 07/09/2016 1448   ALBUMIN 3.7 04/13/2018 1106   ALBUMIN 4.2 07/09/2016 1448   AST 19 03/22/2021 0829   ALT 22 03/22/2021 0829   ALKPHOS 39 04/13/2018 1106   BILITOT 0.7 03/22/2021 0829   BILITOT 0.9 07/09/2016 1448   GFRNONAA 66 03/17/2020 1007   GFRAA 77 03/17/2020 1007   Lab Results  Component Value Date   WBC 8.8 03/22/2021   HGB 12.0 03/22/2021   HCT 37.3 03/22/2021   MCV 84.2 03/22/2021   PLT 567 (H) 03/22/2021    Imaging Studies:   Assessment and Plan:   HARLEEN FINEBERG is a 50 y.o. y/o female who underwent a screening colonoscopy as an outpatient and was found to have patchy areas of colon erythema and small ulcers that showed chronic changes on biopsies  Patient clinically does not have any symptoms of inflammatory bowel disease.  No abdominal pain, diarrhea, blood in stool.  However, she states when she was in fifth grade, she was advised that she has colitis and had to take 6 pills a day and she did that until she was in eighth grade and then stopped taking the medications as she had not had any symptoms.  Question if she was on a 5-ASA product at the time  She was advised to avoid NSAID use  We will attempt to obtain her previous records as she states [facility]  might have these previous records  Check labs for inflammatory markers and fecal calprotectin at this time  We discussed the findings on her colonoscopy may represent mild IBD, versus developing IBD  She will likely need another colonoscopy in 2 to 3 months to reassess findings  We will await lab work at this time.  Since she is clinically asymptomatic, and the diagnosis of IBD is questionable at this time, we will not start IBD treatment at this time.  The areas of erythema and ulceration is very sparing/patchy, only involving 5-10% of the colon, and mild endoscopically, and therefore it would be best to repeat colonoscopy  and obtain more extensive biopsies and reevaluate findings prior to starting any treatment, to see if these findings resolve over time once she completely abstains from all NSAIDs or if they are  still present. It would be useful to obtain previous records as well  Importance of clinic follow-up in 2 to 3 months discussed with patient as well  Dr Madison Conway

## 2021-06-01 LAB — IRON AND TIBC
Iron Saturation: 11 % — ABNORMAL LOW (ref 15–55)
Iron: 44 ug/dL (ref 27–159)
Total Iron Binding Capacity: 401 ug/dL (ref 250–450)
UIBC: 357 ug/dL (ref 131–425)

## 2021-06-01 LAB — C-REACTIVE PROTEIN: CRP: 5 mg/L (ref 0–10)

## 2021-06-01 LAB — HEPATIC FUNCTION PANEL
ALT: 30 IU/L (ref 0–32)
AST: 26 IU/L (ref 0–40)
Albumin: 4.5 g/dL (ref 3.8–4.8)
Alkaline Phosphatase: 90 IU/L (ref 44–121)
Bilirubin Total: 0.8 mg/dL (ref 0.0–1.2)
Bilirubin, Direct: 0.24 mg/dL (ref 0.00–0.40)
Total Protein: 7.6 g/dL (ref 6.0–8.5)

## 2021-06-01 LAB — CBC
Hematocrit: 35.1 % (ref 34.0–46.6)
Hemoglobin: 11.5 g/dL (ref 11.1–15.9)
MCH: 26.9 pg (ref 26.6–33.0)
MCHC: 32.8 g/dL (ref 31.5–35.7)
MCV: 82 fL (ref 79–97)
Platelets: 491 10*3/uL — ABNORMAL HIGH (ref 150–450)
RBC: 4.27 x10E6/uL (ref 3.77–5.28)
RDW: 13.5 % (ref 11.7–15.4)
WBC: 10.6 10*3/uL (ref 3.4–10.8)

## 2021-06-01 LAB — SEDIMENTATION RATE: Sed Rate: 31 mm/hr (ref 0–32)

## 2021-06-01 LAB — FERRITIN: Ferritin: 52 ng/mL (ref 15–150)

## 2021-06-04 ENCOUNTER — Other Ambulatory Visit: Payer: Self-pay | Admitting: Gastroenterology

## 2021-06-04 DIAGNOSIS — K529 Noninfective gastroenteritis and colitis, unspecified: Secondary | ICD-10-CM | POA: Diagnosis not present

## 2021-06-08 LAB — CALPROTECTIN, FECAL: Calprotectin, Fecal: 35 ug/g (ref 0–120)

## 2021-06-11 ENCOUNTER — Encounter: Payer: Self-pay | Admitting: Gastroenterology

## 2021-08-01 ENCOUNTER — Ambulatory Visit: Payer: BC Managed Care – PPO | Admitting: Gastroenterology

## 2021-08-16 ENCOUNTER — Encounter: Payer: Self-pay | Admitting: Internal Medicine

## 2021-10-01 ENCOUNTER — Telehealth: Payer: Self-pay | Admitting: Pharmacist

## 2021-10-01 NOTE — Telephone Encounter (Signed)
?  Chronic Care Management  ? ?Outreach Note ? ?10/01/2021 ?Name: Madison Conway MRN: [id number] DOB: 04-08-1971 ? ?I connected with Madison Conway on 10/01/21 by telephone outreach and verified that I am speaking with the correct person using two identifiers. ? ?Patient appearing on report for [place] Metric Hypertension Control due to last documented ambulatory blood pressure of 159/99 on 05/31/2021. Next appointment with PCP is 03/05/2022  ? ? ? ?Follow Up Plan: CM Pharmacist will outreach to patient by telephone again within the next 30 days ? ?Wallace Cullens, PharmD, BCACP ?Clinical Pharmacist ?[facility] ?[phone] ? ?

## 2022-01-09 ENCOUNTER — Other Ambulatory Visit: Payer: Self-pay | Admitting: Internal Medicine

## 2022-01-09 DIAGNOSIS — Z1231 Encounter for screening mammogram for malignant neoplasm of breast: Secondary | ICD-10-CM

## 2022-01-22 DIAGNOSIS — H524 Presbyopia: Secondary | ICD-10-CM | POA: Diagnosis not present

## 2022-02-27 ENCOUNTER — Ambulatory Visit
Admission: RE | Admit: 2022-02-27 | Discharge: 2022-02-27 | Disposition: A | Discharge status: Home / Self Care | Payer: BC Managed Care – PPO | Source: Ambulatory Visit | Attending: Internal Medicine | Admitting: Internal Medicine

## 2022-02-27 DIAGNOSIS — Z1231 Encounter for screening mammogram for malignant neoplasm of breast: Secondary | ICD-10-CM | POA: Insufficient documentation

## 2022-03-05 ENCOUNTER — Other Ambulatory Visit (HOSPITAL_COMMUNITY)
Admission: RE | Admit: 2022-03-05 | Discharge: 2022-03-05 | Disposition: A | Discharge status: Home / Self Care | Payer: BC Managed Care – PPO | Source: Ambulatory Visit | Attending: Internal Medicine | Admitting: Internal Medicine

## 2022-03-05 ENCOUNTER — Encounter: Payer: Self-pay | Admitting: Internal Medicine

## 2022-03-05 ENCOUNTER — Ambulatory Visit (INDEPENDENT_AMBULATORY_CARE_PROVIDER_SITE_OTHER): Payer: BC Managed Care – PPO | Admitting: Internal Medicine

## 2022-03-05 VITALS — BP 146/86 | HR 86 | Temp 96.9°F | Ht 65.0 in | Wt 181.0 lb

## 2022-03-05 DIAGNOSIS — D75839 Thrombocytosis, unspecified: Secondary | ICD-10-CM

## 2022-03-05 DIAGNOSIS — E6609 Other obesity due to excess calories: Secondary | ICD-10-CM

## 2022-03-05 DIAGNOSIS — Z0001 Encounter for general adult medical examination with abnormal findings: Secondary | ICD-10-CM | POA: Diagnosis not present

## 2022-03-05 DIAGNOSIS — Z124 Encounter for screening for malignant neoplasm of cervix: Secondary | ICD-10-CM | POA: Diagnosis not present

## 2022-03-05 DIAGNOSIS — Z6830 Body mass index (BMI) 30.0-30.9, adult: Secondary | ICD-10-CM

## 2022-03-05 DIAGNOSIS — I10 Essential (primary) hypertension: Secondary | ICD-10-CM | POA: Diagnosis not present

## 2022-03-05 MED ORDER — OLMESARTAN MEDOXOMIL-HCTZ 20-12.5 MG PO TABS: 1.0000 | ORAL_TABLET | Freq: Every day | ORAL | 0 refills | Status: DC

## 2022-03-05 NOTE — Patient Instructions (Signed)

## 2022-03-05 NOTE — Assessment & Plan Note (Signed)
Uncontrolled and not tolerating amlodipine, will discontinue Change HCTZ to olmesartan-HCT 20-12.5 mg daily Reinforced DASH diet and exercise for weight loss C-Met today  RTC in 2 weeks for nurse visit BP check

## 2022-03-05 NOTE — Assessment & Plan Note (Signed)
Encourage diet and exercise for weight loss 

## 2022-03-05 NOTE — Assessment & Plan Note (Signed)
CBC today.  

## 2022-03-05 NOTE — Progress Notes (Signed)
Subjective:    Patient ID: Madison Conway, female    DOB: 1970/09/26, 51 y.o.   MRN: [id number]  HPI  Patient presents to clinic today for her annual exam.  Flu: 02/2021 Tetanus: 11/2015 COVID: Moderna x3 Pap smear: 09/2016 Mammogram: 02/2022 Colon screening: 04/2021 Vision screening: annually Dentist: biannually  Diet: She does eat some lean meat. She consumes fruits and veggies. She does eat some fried foods. She drinks mostly water. Exercise: Walking  Review of Systems     Past Medical History:  Diagnosis Date   Asthma    childhood.    Current Outpatient Medications  Medication Sig Dispense Refill   amLODipine (NORVASC) 10 MG tablet Take 1 tablet (10 mg total) by mouth 3 (three) times a week. 90 tablet 2   famotidine (PEPCID) 10 MG tablet Take 10 mg by mouth daily as needed for heartburn or indigestion.     ferrous sulfate (FER-IN-SOL) 75 (15 Fe) MG/ML SOLN Take 15 mg of iron by mouth 3 (three) times a week.      hydrochlorothiazide (HYDRODIURIL) 12.5 MG tablet Take 1 tablet (12.5 mg total) by mouth 3 (three) times a week. 90 tablet 2   Multiple Vitamins-Minerals (MULTIVITAMIN ADULT PO) Take 1 tablet by mouth every other day.      norethindrone-ethinyl estradiol 1/35 (ALAYCEN 1/35) tablet Take 1 tablet by mouth daily. 28 tablet 12   No current facility-administered medications for this visit.    No Known Allergies  Family History  Problem Relation Age of Onset   Diabetes Maternal Grandmother    Breast cancer Neg Hx    Colon cancer Neg Hx    Ovarian cancer Neg Hx     Social History   Socioeconomic History   Marital status: Single    Spouse name: Not on file   Number of children: Not on file   Years of education: Not on file   Highest education level: Not on file  Occupational History   Not on file  Tobacco Use   Smoking status: Never   Smokeless tobacco: Never  Vaping Use   Vaping Use: Never used  Substance and Sexual Activity   Alcohol use: Yes     Alcohol/week: 2.0 - 3.0 standard drinks of alcohol    Types: 2 - 3 Standard drinks or equivalent per week   Drug use: No   Sexual activity: Yes    Birth control/protection: Pill  Other Topics Concern   Not on file  Social History Narrative   Not on file   Social Determinants of Health   Financial Resource Strain: Not on file  Food Insecurity: Not on file  Transportation Needs: Not on file  Physical Activity: Not on file  Stress: Not on file  Social Connections: Not on file  Intimate Partner Violence: Not on file     Constitutional: Denies fever, malaise, fatigue, headache or abrupt weight changes.  HEENT: Denies eye pain, eye redness, ear pain, ringing in the ears, wax buildup, runny nose, nasal congestion, bloody nose, or sore throat. Respiratory: Denies difficulty breathing, shortness of breath, cough or sputum production.   Cardiovascular: Denies chest pain, chest tightness, palpitations or swelling in the hands or feet.  Gastrointestinal: Denies abdominal pain, bloating, constipation, diarrhea or blood in the stool.  GU: Denies urgency, frequency, pain with urination, burning sensation, blood in urine, odor or discharge. Musculoskeletal: Denies decrease in range of motion, difficulty with gait, muscle pain or joint pain and swelling.  Skin: Denies redness,  rashes, lesions or ulcercations.  Neurological: Denies dizziness, difficulty with memory, difficulty with speech or problems with balance and coordination.  Psych: Denies anxiety, depression, SI/HI.  No other specific complaints in a complete review of systems (except as listed in HPI above).  Objective:   Physical Exam   BP (!) 146/86 (BP Location: Right Arm, Patient Position: Sitting, Cuff Size: Normal)   Pulse 86   Temp (!) 96.9 F (36.1 C) (Temporal)   Ht _0  (1.651 m)   Wt 181 lb (82.1 kg)   LMP 02/02/2022 (Approximate)   SpO2 99%   BMI 30.12 kg/m   Wt Readings from Last 3 Encounters:  05/31/21 182  lb (82.6 kg)  05/10/21 174 lb (78.9 kg)  03/22/21 174 lb (78.9 kg)    General: Appears her stated age, obese, in NAD. Skin: Warm, dry and intact. HEENT: Head: normal shape and size; Eyes: sclera white, no icterus, conjunctiva pink, PERRLA and EOMs intact;  Neck:  Neck supple, trachea midline. No masses, lumps or thyromegaly present.  Cardiovascular: Normal rate and rhythm. S1,S2 noted.  No murmur, rubs or gallops noted. No JVD or BLE edema. Pulmonary/Chest: Normal effort and positive vesicular breath sounds. No respiratory distress. No wheezes, rales or ronchi noted.  Abdomen: Soft and nontender. Normal bowel sounds. No distention or masses noted.  Pelvic: Normal female anatomy.  Cervix with abnormal changes around the os.  No CMT.  Adnexa nonpalpable. Musculoskeletal: Strength 5/5 BUE/BLE.  No difficulty with gait.  Neurological: Alert and oriented. Cranial nerves II-XII grossly intact. Coordination normal.  Psychiatric: Mood and affect normal. Behavior is normal. Judgment and thought content normal.     BMET    Component Value Date/Time   NA 137 03/22/2021 0829   NA 138 07/09/2016 1448   K 4.4 03/22/2021 0829   CL 102 03/22/2021 0829   CO2 26 03/22/2021 0829   GLUCOSE 86 03/22/2021 0829   BUN 12 03/22/2021 0829   BUN 8 03/04/2017 0000   CREATININE 1.00 (H) 03/22/2021 0829   CALCIUM 9.2 03/22/2021 0829   GFRNONAA 66 03/17/2020 1007   GFRAA 77 03/17/2020 1007    Lipid Panel     Component Value Date/Time   CHOL 219 (H) 03/22/2021 0829   CHOL 195 02/28/2016 0000   TRIG 57 03/22/2021 0829   TRIG 86 02/28/2016 0000   HDL 130 03/22/2021 0829   CHOLHDL 1.7 03/22/2021 0829   LDLCALC 76 03/22/2021 0829    CBC    Component Value Date/Time   WBC 10.6 05/31/2021 1628   WBC 8.8 03/22/2021 0829   RBC 4.27 05/31/2021 1628   RBC 4.43 03/22/2021 0829   HGB 11.5 05/31/2021 1628   HCT 35.1 05/31/2021 1628   PLT 491 (H) 05/31/2021 1628   MCV 82 05/31/2021 1628   MCH 26.9  05/31/2021 1628   MCH 27.1 03/22/2021 0829   MCHC 32.8 05/31/2021 1628   MCHC 32.2 03/22/2021 0829   RDW 13.5 05/31/2021 1628   LYMPHSABS 1,853 03/17/2020 1007   LYMPHSABS 2.2 03/29/2019 1536   MONOABS 0.6 04/13/2018 1106   EOSABS 362 03/17/2020 1007   EOSABS 0.2 03/29/2019 1536   BASOSABS 57 03/17/2020 1007   BASOSABS 0.0 03/29/2019 1536    Hgb A1C Lab Results  Component Value Date   HGBA1C 5.0 03/22/2021           Assessment & Plan:   Preventative Health Maintenance:  She declines flu shot today Tetanus UTD Encouraged her to get her  COVID-vaccine Pap smear today-with STD screening Mammogram UTD Colon screening UTD Encouraged her to consume a balanced diet and exercise regimen Advised her to see an eye doctor and dentist annually We will check CBC, c-Met and lipid profile today  RTC in 2 weeks for nurse visit BP check, 6 months, follow-up chronic conditions Webb Silversmith, NP

## 2022-03-06 LAB — LIPID PANEL
Cholesterol: 228 mg/dL — ABNORMAL HIGH (ref <200)
HDL: 138 mg/dL (ref >=50)
LDL Cholesterol (Calc): 70 mg/dL (calc)
Non-HDL Cholesterol (Calc): 90 mg/dL (calc) (ref <130)
Total CHOL/HDL Ratio: 1.7 (calc) (ref <5.0)
Triglycerides: 118 mg/dL (ref <150)

## 2022-03-06 LAB — CBC
HCT: 36.1 % (ref 35.0–45.0)
Hemoglobin: 12 g/dL (ref 11.7–15.5)
MCH: 27.5 pg (ref 27.0–33.0)
MCHC: 33.2 g/dL (ref 32.0–36.0)
MCV: 82.8 fL (ref 80.0–100.0)
MPV: 8.3 fL (ref 7.5–12.5)
Platelets: 459 10*3/uL — ABNORMAL HIGH (ref 140–400)
RBC: 4.36 10*6/uL (ref 3.80–5.10)
RDW: 13.8 % (ref 11.0–15.0)
WBC: 9.6 10*3/uL (ref 3.8–10.8)

## 2022-03-06 LAB — COMPLETE METABOLIC PANEL WITH GFR
AG Ratio: 1.3 (calc) (ref 1.0–2.5)
ALT: 24 U/L (ref 6–29)
AST: 21 U/L (ref 10–35)
Albumin: 4 g/dL (ref 3.6–5.1)
Alkaline phosphatase (APISO): 63 U/L (ref 31–125)
BUN/Creatinine Ratio: 11 (calc) (ref 6–22)
BUN: 11 mg/dL (ref 7–25)
CO2: 26 mmol/L (ref 20–32)
Calcium: 9.1 mg/dL (ref 8.6–10.2)
Chloride: 100 mmol/L (ref 98–110)
Creat: 1.03 mg/dL — ABNORMAL HIGH (ref 0.50–0.99)
Globulin: 3.2 g/dL (calc) (ref 1.9–3.7)
Glucose, Bld: 86 mg/dL (ref 65–99)
Potassium: 3.8 mmol/L (ref 3.5–5.3)
Sodium: 136 mmol/L (ref 135–146)
Total Bilirubin: 0.8 mg/dL (ref 0.2–1.2)
Total Protein: 7.2 g/dL (ref 6.1–8.1)
eGFR: 67 mL/min/{1.73_m2} (ref >=60)

## 2022-03-11 LAB — CYTOLOGY - PAP
Chlamydia: NEGATIVE
Comment: NEGATIVE
Comment: NEGATIVE
Comment: NEGATIVE
Comment: NORMAL
Diagnosis: NEGATIVE
High risk HPV: NEGATIVE
Neisseria Gonorrhea: NEGATIVE
Trichomonas: NEGATIVE

## 2022-03-19 ENCOUNTER — Ambulatory Visit: Payer: BC Managed Care – PPO

## 2022-03-19 NOTE — Progress Notes (Unsigned)
Hypertension, follow-up  BP Readings from Last 3 Encounters:  03/05/22 (!) 146/86  05/31/21 (!) 159/99  05/10/21 (!) 145/86   Wt Readings from Last 3 Encounters:  03/05/22 181 lb (82.1 kg)  05/31/21 182 lb (82.6 kg)  05/10/21 174 lb (78.9 kg)     She was last seen for hypertension 03/05/2022. BP at that visit was 146/86.  Management since that visit includes stopping amlodipine and HCTZ and changing to olmesartan-HCTZ 20/12.'5mg'$  daily  She reports excellent compliance with treatment. She is not having side effects.   Today BP 133/78    ---------------------------------------------------------------------------------------------------

## 2022-03-20 NOTE — Progress Notes (Signed)
BP looks great.  Continue current treatment.

## 2022-03-23 ENCOUNTER — Other Ambulatory Visit: Payer: Self-pay | Admitting: Internal Medicine

## 2022-03-23 DIAGNOSIS — Z3041 Encounter for surveillance of contraceptive pills: Secondary | ICD-10-CM

## 2022-03-25 NOTE — Telephone Encounter (Signed)
Requested Prescriptions  Pending Prescriptions Disp Refills  . ALAYCEN 1/35 tablet [Pharmacy Med Name: ALYACEN 1-35 28 TABLET] 28 tablet 11    Sig: TAKE 1 TABLET BY MOUTH EVERY DAY     OB/GYN:  Contraceptives Passed - 03/23/2022  1:33 PM      Passed - Last BP in normal range    BP Readings from Last 1 Encounters:  03/19/22 133/78         Passed - Valid encounter within last 12 months    Recent Outpatient Visits          2 weeks ago Encounter for general adult medical examination with abnormal findings   [facility] [place], Coralie Keens, NP   1 year ago Encounter for general adult medical examination with abnormal findings   [facility] [place], Coralie Keens, NP   2 years ago Annual physical exam   [facility], Lupita Raider, [place]   3 years ago Annual physical exam   [facility] [employer], NP   3 years ago Essential hypertension   [facility] Merrilyn Puma, Jerrel Ivory, NP             Passed - Patient is not a smoker      . olmesartan-hydrochlorothiazide (BENICAR HCT) 20-12.5 MG tablet [Pharmacy Med Name: OLMESARTAN-HCTZ 20-12.5 MG TAB] 90 tablet 1    Sig: TAKE 1 TABLET BY MOUTH EVERY DAY     Cardiovascular: ARB + Diuretic Combos Failed - 03/23/2022  1:33 PM      Failed - Cr in normal range and within 180 days    Creat  Date Value Ref Range Status  03/05/2022 1.03 (H) 0.50 - 0.99 mg/dL Final         Passed - K in normal range and within 180 days    Potassium  Date Value Ref Range Status  03/05/2022 3.8 3.5 - 5.3 mmol/L Final         Passed - Na in normal range and within 180 days    Sodium  Date Value Ref Range Status  03/05/2022 136 135 - 146 mmol/L Final  07/09/2016 138 134 - 144 mmol/L Final         Passed - eGFR is 10 or above and within 180 days    GFR, Est African American  Date Value Ref Range Status  03/17/2020 77 > OR = 60 mL/min/1.64m Final   GFR, Est Non African American   Date Value Ref Range Status  03/17/2020 66 > OR = 60 mL/min/1.743mFinal   eGFR  Date Value Ref Range Status  03/05/2022 67 > OR = 60 mL/min/1.7388minal         Passed - Patient is not pregnant      Passed - Last BP in normal range    BP Readings from Last 1 Encounters:  03/19/22 133/78         Passed - Valid encounter within last 6 months    Recent Outpatient Visits          2 weeks ago [facility] for general [facility] examination with abnormal findings   [facility]   1 year ago [facility]counter for general adult medical examination with abnormal findings   [facility]   [facility] ago [facility] physical [facility]   3 years  ago Annual physical exam   [facility] [employer], NP   3 years ago Essential hypertension   [facility] Merrilyn Puma, Jerrel Ivory, NP

## 2022-03-26 ENCOUNTER — Encounter: Payer: BC Managed Care – PPO | Admitting: Internal Medicine

## 2022-03-29 ENCOUNTER — Other Ambulatory Visit: Payer: Self-pay | Admitting: Internal Medicine

## 2022-03-29 DIAGNOSIS — I10 Essential (primary) hypertension: Secondary | ICD-10-CM

## 2022-03-29 NOTE — Telephone Encounter (Signed)
Unable to refill per protocol, Rx expired. Medication was discontinued 03/05/22 by PCP.  Will refuse.  Requested Prescriptions  Pending Prescriptions Disp Refills  . hydrochlorothiazide (HYDRODIURIL) 12.5 MG tablet [Pharmacy Med Name: HYDROCHLOROTHIAZIDE 12.5 MG TB] 30 tablet 8    Sig: TAKE 1 TABLET (12.5 MG TOTAL) BY MOUTH 3 (THREE) TIMES A WEEK.     Cardiovascular: Diuretics - Thiazide Failed - 03/29/2022  2:23 AM      Failed - Cr in normal range and within 180 days    Creat  Date Value Ref Range Status  03/05/2022 1.03 (H) 0.50 - 0.99 mg/dL Final         Passed - K in normal range and within 180 days    Potassium  Date Value Ref Range Status  03/05/2022 3.8 3.5 - 5.3 mmol/L Final         Passed - Na in normal range and within 180 days    Sodium  Date Value Ref Range Status  03/05/2022 136 135 - 146 mmol/L Final  07/09/2016 138 134 - 144 mmol/L Final         Passed - Last BP in normal range    BP Readings from Last 1 Encounters:  03/19/22 133/78         Passed - Valid encounter within last 6 months    Recent Outpatient Visits          3 weeks ago Encounter for general adult medical examination with abnormal findings   [facility], Coralie Keens, NP   1 year ago Encounter for general adult medical examination with abnormal findings   [facility] [place], Coralie Keens, NP   2 years ago Annual physical exam   [facility], Lupita Raider, [place]   3 years ago Annual physical exam   [facility] [employer], NP   3 years ago Essential hypertension   [facility] Merrilyn Puma, Jerrel Ivory, NP

## 2022-04-02 ENCOUNTER — Other Ambulatory Visit: Payer: Self-pay | Admitting: Internal Medicine

## 2022-08-05 ENCOUNTER — Observation Stay
Admission: EM | Admit: 2022-08-05 | Discharge: 2022-08-07 | Disposition: A | Discharge status: Home / Self Care | Payer: BC Managed Care – PPO | Attending: Emergency Medicine | Admitting: Emergency Medicine

## 2022-08-05 ENCOUNTER — Emergency Department: Payer: BC Managed Care – PPO

## 2022-08-05 ENCOUNTER — Other Ambulatory Visit: Payer: Self-pay

## 2022-08-05 ENCOUNTER — Encounter: Payer: Self-pay | Admitting: Emergency Medicine

## 2022-08-05 DIAGNOSIS — N183 Chronic kidney disease, stage 3 unspecified: Secondary | ICD-10-CM | POA: Diagnosis not present

## 2022-08-05 DIAGNOSIS — D75839 Thrombocytosis, unspecified: Secondary | ICD-10-CM | POA: Diagnosis present

## 2022-08-05 DIAGNOSIS — J45909 Unspecified asthma, uncomplicated: Secondary | ICD-10-CM | POA: Diagnosis not present

## 2022-08-05 DIAGNOSIS — Z789 Other specified health status: Secondary | ICD-10-CM | POA: Diagnosis not present

## 2022-08-05 DIAGNOSIS — D508 Other iron deficiency anemias: Secondary | ICD-10-CM | POA: Diagnosis not present

## 2022-08-05 DIAGNOSIS — N1831 Chronic kidney disease, stage 3a: Secondary | ICD-10-CM | POA: Diagnosis present

## 2022-08-05 DIAGNOSIS — Z833 Family history of diabetes mellitus: Secondary | ICD-10-CM

## 2022-08-05 DIAGNOSIS — I129 Hypertensive chronic kidney disease with stage 1 through stage 4 chronic kidney disease, or unspecified chronic kidney disease: Secondary | ICD-10-CM | POA: Diagnosis not present

## 2022-08-05 DIAGNOSIS — K8050 Calculus of bile duct without cholangitis or cholecystitis without obstruction: Principal | ICD-10-CM

## 2022-08-05 DIAGNOSIS — Z6830 Body mass index (BMI) 30.0-30.9, adult: Secondary | ICD-10-CM

## 2022-08-05 DIAGNOSIS — D509 Iron deficiency anemia, unspecified: Secondary | ICD-10-CM | POA: Diagnosis not present

## 2022-08-05 DIAGNOSIS — I10 Essential (primary) hypertension: Secondary | ICD-10-CM | POA: Diagnosis not present

## 2022-08-05 DIAGNOSIS — Z79899 Other long term (current) drug therapy: Secondary | ICD-10-CM | POA: Diagnosis not present

## 2022-08-05 DIAGNOSIS — R112 Nausea with vomiting, unspecified: Secondary | ICD-10-CM

## 2022-08-05 DIAGNOSIS — K8070 Calculus of gallbladder and bile duct without cholecystitis without obstruction: Principal | ICD-10-CM | POA: Diagnosis present

## 2022-08-05 DIAGNOSIS — E66811 Obesity, class 1: Secondary | ICD-10-CM

## 2022-08-05 DIAGNOSIS — R1011 Right upper quadrant pain: Secondary | ICD-10-CM | POA: Diagnosis not present

## 2022-08-05 DIAGNOSIS — N182 Chronic kidney disease, stage 2 (mild): Secondary | ICD-10-CM | POA: Insufficient documentation

## 2022-08-05 DIAGNOSIS — E6609 Other obesity due to excess calories: Secondary | ICD-10-CM | POA: Diagnosis not present

## 2022-08-05 DIAGNOSIS — R9431 Abnormal electrocardiogram [ECG] [EKG]: Secondary | ICD-10-CM | POA: Diagnosis not present

## 2022-08-05 DIAGNOSIS — J4520 Mild intermittent asthma, uncomplicated: Secondary | ICD-10-CM | POA: Diagnosis not present

## 2022-08-05 DIAGNOSIS — E876 Hypokalemia: Secondary | ICD-10-CM | POA: Insufficient documentation

## 2022-08-05 DIAGNOSIS — D649 Anemia, unspecified: Secondary | ICD-10-CM | POA: Diagnosis not present

## 2022-08-05 DIAGNOSIS — I441 Atrioventricular block, second degree: Secondary | ICD-10-CM | POA: Diagnosis present

## 2022-08-05 DIAGNOSIS — F1090 Alcohol use, unspecified, uncomplicated: Secondary | ICD-10-CM | POA: Diagnosis present

## 2022-08-05 DIAGNOSIS — K219 Gastro-esophageal reflux disease without esophagitis: Secondary | ICD-10-CM | POA: Diagnosis not present

## 2022-08-05 HISTORY — DX: Essential (primary) hypertension: I10

## 2022-08-05 HISTORY — DX: Thrombocytosis, unspecified: D75.839

## 2022-08-05 LAB — APTT: aPTT: 24 seconds (ref 24–36)

## 2022-08-05 LAB — URINALYSIS, ROUTINE W REFLEX MICROSCOPIC
Bacteria, UA: NONE SEEN
Bilirubin Urine: NEGATIVE
Glucose, UA: NEGATIVE mg/dL
Hgb urine dipstick: NEGATIVE
Ketones, ur: NEGATIVE mg/dL
Nitrite: NEGATIVE
Protein, ur: 30 mg/dL — AB
Specific Gravity, Urine: 1.029 (ref 1.005–1.030)
pH: 7 (ref 5.0–8.0)

## 2022-08-05 LAB — SURGICAL PCR SCREEN
MRSA, PCR: NEGATIVE
Staphylococcus aureus: NEGATIVE

## 2022-08-05 LAB — PROTIME-INR
INR: 1.1 (ref 0.8–1.2)
Prothrombin Time: 13.7 seconds (ref 11.4–15.2)

## 2022-08-05 LAB — CBC
HCT: 34.3 % — ABNORMAL LOW (ref 36.0–46.0)
Hemoglobin: 11.1 g/dL — ABNORMAL LOW (ref 12.0–15.0)
MCH: 27.3 pg (ref 26.0–34.0)
MCHC: 32.4 g/dL (ref 30.0–36.0)
MCV: 84.5 fL (ref 80.0–100.0)
Platelets: 429 10*3/uL — ABNORMAL HIGH (ref 150–400)
RBC: 4.06 MIL/uL (ref 3.87–5.11)
RDW: 14.8 % (ref 11.5–15.5)
WBC: 13.2 10*3/uL — ABNORMAL HIGH (ref 4.0–10.5)
nRBC: 0 % (ref 0.0–0.2)

## 2022-08-05 LAB — POC URINE PREG, ED: Preg Test, Ur: NEGATIVE

## 2022-08-05 LAB — COMPREHENSIVE METABOLIC PANEL
ALT: 43 U/L (ref 0–44)
AST: 67 U/L — ABNORMAL HIGH (ref 15–41)
Albumin: 3.6 g/dL (ref 3.5–5.0)
Alkaline Phosphatase: 56 U/L (ref 38–126)
Anion gap: 9 (ref 5–15)
BUN: 12 mg/dL (ref 6–20)
CO2: 25 mmol/L (ref 22–32)
Calcium: 8.7 mg/dL — ABNORMAL LOW (ref 8.9–10.3)
Chloride: 103 mmol/L (ref 98–111)
Creatinine, Ser: 1.02 mg/dL — ABNORMAL HIGH (ref 0.44–1.00)
GFR, Estimated: >60 mL/min (ref >60)
Glucose, Bld: 140 mg/dL — ABNORMAL HIGH (ref 70–99)
Potassium: 3.7 mmol/L (ref 3.5–5.1)
Sodium: 137 mmol/L (ref 135–145)
Total Bilirubin: 0.8 mg/dL (ref 0.3–1.2)
Total Protein: 7.4 g/dL (ref 6.5–8.1)

## 2022-08-05 LAB — HIV ANTIBODY (ROUTINE TESTING W REFLEX): HIV Screen 4th Generation wRfx: NONREACTIVE

## 2022-08-05 LAB — TROPONIN I (HIGH SENSITIVITY)
Troponin I (High Sensitivity): 3 ng/L (ref <18)
Troponin I (High Sensitivity): 3 ng/L (ref <18)

## 2022-08-05 LAB — MAGNESIUM: Magnesium: 1.9 mg/dL (ref 1.7–2.4)

## 2022-08-05 LAB — LIPASE, BLOOD: Lipase: 52 U/L — ABNORMAL HIGH (ref 11–51)

## 2022-08-05 MED ORDER — ACETAMINOPHEN 325 MG PO TABS: 650.0000 mg | ORAL_TABLET | Freq: Four times a day (QID) | ORAL | Status: DC | PRN

## 2022-08-05 MED ORDER — SODIUM CHLORIDE 0.9 % IV SOLN
2.0000 g | Freq: Once | INTRAVENOUS | Status: DC
  Filled 2022-08-05: qty 20

## 2022-08-05 MED ORDER — IRBESARTAN 150 MG PO TABS
150.0000 mg | ORAL_TABLET | Freq: Every day | ORAL | Status: DC
  Administered 2022-08-05 – 2022-08-06 (×2): 150 mg via ORAL
  Filled 2022-08-05 (×2): qty 1

## 2022-08-05 MED ORDER — MUPIROCIN 2 % EX OINT
1.0000 | TOPICAL_OINTMENT | Freq: Two times a day (BID) | CUTANEOUS | Status: DC
  Administered 2022-08-06: 1 via NASAL
  Filled 2022-08-05: qty 22

## 2022-08-05 MED ORDER — KETOROLAC TROMETHAMINE 30 MG/ML IJ SOLN
15.0000 mg | Freq: Once | INTRAMUSCULAR | Status: AC
  Administered 2022-08-05: 15 mg via INTRAVENOUS
  Filled 2022-08-05: qty 1

## 2022-08-05 MED ORDER — THIAMINE MONONITRATE 100 MG PO TABS
100.0000 mg | ORAL_TABLET | Freq: Every day | ORAL | Status: DC
  Administered 2022-08-06: 100 mg via ORAL
  Filled 2022-08-05 (×2): qty 1

## 2022-08-05 MED ORDER — HYDROMORPHONE HCL 1 MG/ML IJ SOLN: 1.0000 mg | INTRAMUSCULAR | Status: DC | PRN

## 2022-08-05 MED ORDER — FERROUS SULFATE 325 (65 FE) MG PO TABS
325.0000 mg | ORAL_TABLET | ORAL | Status: DC
  Administered 2022-08-05: 325 mg via ORAL
  Filled 2022-08-05: qty 1

## 2022-08-05 MED ORDER — ADULT MULTIVITAMIN W/MINERALS CH
1.0000 | ORAL_TABLET | ORAL | Status: DC
  Administered 2022-08-06: 1 via ORAL
  Filled 2022-08-05: qty 1

## 2022-08-05 MED ORDER — GADOBUTROL 1 MMOL/ML IV SOLN
7.5000 mL | Freq: Once | INTRAVENOUS | Status: AC | PRN
  Administered 2022-08-05: 7.5 mL via INTRAVENOUS

## 2022-08-05 MED ORDER — FOLIC ACID 1 MG PO TABS
1.0000 mg | ORAL_TABLET | Freq: Every day | ORAL | Status: DC
  Administered 2022-08-06: 1 mg via ORAL
  Filled 2022-08-05 (×2): qty 1

## 2022-08-05 MED ORDER — NORETHINDRONE-ETH ESTRADIOL 1-35 MG-MCG PO TABS: 1.0000 | ORAL_TABLET | Freq: Every day | ORAL | Status: DC

## 2022-08-05 MED ORDER — SODIUM CHLORIDE 0.9 % IV SOLN: INTRAVENOUS | Status: AC

## 2022-08-05 MED ORDER — LACTATED RINGERS IV BOLUS
1000.0000 mL | Freq: Once | INTRAVENOUS | Status: AC
  Administered 2022-08-05: 1000 mL via INTRAVENOUS

## 2022-08-05 MED ORDER — DM-GUAIFENESIN ER 30-600 MG PO TB12: 1.0000 | ORAL_TABLET | Freq: Two times a day (BID) | ORAL | Status: DC | PRN

## 2022-08-05 MED ORDER — ONDANSETRON HCL 4 MG/2ML IJ SOLN: 4.0000 mg | Freq: Three times a day (TID) | INTRAMUSCULAR | Status: DC | PRN

## 2022-08-05 MED ORDER — ALBUTEROL SULFATE (2.5 MG/3ML) 0.083% IN NEBU: 3.0000 mL | INHALATION_SOLUTION | RESPIRATORY_TRACT | Status: DC | PRN

## 2022-08-05 MED ORDER — METRONIDAZOLE 500 MG/100ML IV SOLN
500.0000 mg | Freq: Once | INTRAVENOUS | Status: DC
  Filled 2022-08-05: qty 100

## 2022-08-05 MED ORDER — MORPHINE SULFATE (PF) 4 MG/ML IV SOLN
4.0000 mg | Freq: Once | INTRAVENOUS | Status: AC
  Administered 2022-08-05: 4 mg via INTRAVENOUS
  Filled 2022-08-05: qty 1

## 2022-08-05 MED ORDER — HYDROCHLOROTHIAZIDE 12.5 MG PO TABS
12.5000 mg | ORAL_TABLET | Freq: Every day | ORAL | Status: DC
  Administered 2022-08-05 – 2022-08-06 (×2): 12.5 mg via ORAL
  Filled 2022-08-05 (×2): qty 1

## 2022-08-05 MED ORDER — HYDROMORPHONE HCL 1 MG/ML IJ SOLN: 0.5000 mg | INTRAMUSCULAR | Status: DC | PRN

## 2022-08-05 MED ORDER — ONDANSETRON HCL 4 MG/2ML IJ SOLN
4.0000 mg | INTRAMUSCULAR | Status: AC
  Administered 2022-08-05: 4 mg via INTRAVENOUS
  Filled 2022-08-05: qty 2

## 2022-08-05 MED ORDER — PIPERACILLIN-TAZOBACTAM 3.375 G IVPB
3.3750 g | Freq: Three times a day (TID) | INTRAVENOUS | Status: DC
  Administered 2022-08-05 – 2022-08-07 (×6): 3.375 g via INTRAVENOUS
  Filled 2022-08-05 (×5): qty 50

## 2022-08-05 MED ORDER — OXYCODONE-ACETAMINOPHEN 5-325 MG PO TABS
1.0000 | ORAL_TABLET | ORAL | Status: DC | PRN
  Administered 2022-08-05 – 2022-08-06 (×3): 1 via ORAL
  Filled 2022-08-05 (×3): qty 1

## 2022-08-05 MED ORDER — OLMESARTAN MEDOXOMIL-HCTZ 20-12.5 MG PO TABS: 1.0000 | ORAL_TABLET | Freq: Every day | ORAL | Status: DC

## 2022-08-05 MED ORDER — FAMOTIDINE 20 MG PO TABS: 10.0000 mg | ORAL_TABLET | Freq: Every day | ORAL | Status: DC | PRN

## 2022-08-05 MED ORDER — HYDRALAZINE HCL 20 MG/ML IJ SOLN: 5.0000 mg | INTRAMUSCULAR | Status: DC | PRN

## 2022-08-05 NOTE — Plan of Care (Signed)

## 2022-08-05 NOTE — ED Provider Notes (Signed)
Assumed care at 7 AM. Briefly, 52 yo F here with RUQ abdominal pain. MRCP shows choledocholithiasis. Mild leukocytosis noted but pt is afebrile w/o signs of sepsis. Will start on empiric ABX. Discussed with Dr. Haig Prophet of GI - will see pt and d/w Dr. Allen Norris. Admit to Hospitalist service.  Of note, pt had transient type II AV block on arrival during active vomiting - no CP now and trop negative. No h/o cardiac disease or signs of ischemia clinically. Will need to discuss with Cards/Anesthesia.    Duffy Bruce, MD 08/05/22 [phone]

## 2022-08-05 NOTE — H&P (Signed)
History and Physical    Madison Conway [id number] DOB: 1970/07/26 DOA: 08/05/2022  Referring MD/NP/PA:   PCP: Jearld Fenton, NP   Patient coming from:  The patient is coming from home.  At baseline, pt is independent for most of ADL.        Chief Complaint: Abdominal pain,   HPI: Madison Conway is a 52 y.o. female with medical history significant of hypertension, asthma, GERD, thrombocytosis, anemia, obesity obesity BMI 30.42, alcohol use, who presents with abdominal pain.   Patient states that her abdominal pain started last night, which is located on right upper quadrant, constant, initially 10 out of 10 in severity, currently 3 out of 10 in severity, radiating to the back, associated with multiple episodes of nonbilious nonbloody vomiting.  Patient has few times of loose stool bowel movement, but no active diarrhea.  No fever or chills.  Patient does not have chest pain, cough, shortness of breath.  No symptoms of UTI.  Patient states that she drinks 1- 2 glasses of mixed alcohol, 3-4 times per week.   Data reviewed independently and ED Course: pt was found to have WBC 13.2, GFR> 60, normal liver function, lipase 52, temperature normal, blood pressure 130/76, heart rate 89, RR 20, oxygen saturation 100% on room air.  Patient is admitted to [place] bed as inpatient.  Dr. Haig Prophet for GI is consulted by EDP, who will discuss with Dr. Allen Norris for possible ERCP.  Consulted Dr. Fletcher Anon of cardiology.  US-RUQ: 1. Distended gallbladder with mild wall thickening. No pericholecystic fluid or positive sonographic Murphy's sign. 2. A solitary 7.5 mm linear echogenic structure is noted mobile within the lumen and could be a nonshadowing stone or a cholesterol crystal. 3. Prominent common bile duct measuring 8.2 mm. On the MRI the common bile duct was 5.4 mm. The distal common bile duct is obscured by bowel gas. Visualized portion without filling defects   MRCP: 1. Choledocholithiasis  with mild dilatation of the common bile duct and intrahepatic bile ducts. 2. Mild gallbladder wall thickening is identified. No stones noted within the lumen of the gallbladder  EKG: I have personally reviewed.  The first EKG showed Mobitz 2 AV block.  The repeated EKG showed sinus rhythm, QTc 443, early R wave progression, T wave inversion only in lead III.   Review of Systems:   General: no fevers, chills, no body weight gain HEENT: no blurry vision, hearing changes or sore throat Respiratory: no dyspnea, coughing, wheezing CV: no chest pain, no palpitations GI: has nausea, vomiting, abdominal pain, no constipation GU: no dysuria, burning on urination, increased urinary frequency, hematuria  Ext: no leg edema Neuro: no unilateral weakness, numbness, or tingling, no vision change or hearing loss Skin: no rash, no skin tear. MSK: No muscle spasm, no deformity, no limitation of range of movement in spin Heme: No easy bruising.  Travel history: No recent long distant travel.   Allergy: No Known Allergies  Past Medical History:  Diagnosis Date   Asthma    childhood.   HTN (hypertension)    Thrombocytosis     Past Surgical History:  Procedure Laterality Date   COLONOSCOPY WITH PROPOFOL N/A 05/10/2021   Procedure: COLONOSCOPY WITH PROPOFOL;  Surgeon: Virgel Manifold, MD;  Location: [facility] ENDOSCOPY;  Service: Endoscopy;  Laterality: N/A;   NO PAST SURGERIES      Social History:  reports that she has never smoked. She has never used smokeless tobacco. She reports current alcohol use  of about 2.0 - 3.0 standard drinks of alcohol per week. She reports that she does not use drugs.  Family History:  Family History  Problem Relation Age of Onset   Diabetes Maternal Grandmother    Breast cancer Neg Hx    Colon cancer Neg Hx    Ovarian cancer Neg Hx      Prior to Admission medications   Medication Sig Start Date End Date Taking? Authorizing Provider  ALAYCEN 1/35 tablet  TAKE 1 TABLET BY MOUTH EVERY DAY 03/25/22   Jearld Fenton, NP  famotidine (PEPCID) 10 MG tablet Take 10 mg by mouth daily as needed for heartburn or indigestion.    [provider]  ferrous sulfate (FER-IN-SOL) 75 (15 Fe) MG/ML SOLN Take 15 mg of iron by mouth 3 (three) times a week.     [provider]  Multiple Vitamins-Minerals (MULTIVITAMIN ADULT PO) Take 1 tablet by mouth every other day.     [provider]  olmesartan-hydrochlorothiazide (BENICAR HCT) 20-12.5 MG tablet TAKE 1 TABLET BY MOUTH EVERY DAY 03/25/22   Jearld Fenton, NP    Physical Exam: Vitals:   08/05/22 0722 08/05/22 0730 08/05/22 1030 08/05/22 1100  BP: 130/76 130/74 123/75 131/76  Pulse: 77 64 80 88  Resp: 18 18 15 19  $ Temp:    98.6 F (37 C)  TempSrc:    Oral  SpO2: 100% 100% 100% 100%  Weight:      Height:       General: Not in acute distress HEENT:       Eyes: PERRL, EOMI, no scleral icterus.       ENT: No discharge from the ears and nose, no pharynx injection, no tonsillar enlargement.        Neck: No JVD, no bruit, no mass felt. Heme: No neck lymph node enlargement. Cardiac: S1/S2, RRR, No murmurs, No gallops or rubs. Respiratory: No rales, wheezing, rhonchi or rubs. GI: Soft, nondistended, has RUQ tenderness, no rebound pain, no organomegaly, BS present. GU: No hematuria Ext: No pitting leg edema bilaterally. 1+DP/PT pulse bilaterally. Musculoskeletal: No joint deformities, No joint redness or warmth, no limitation of ROM in spin. Skin: No rashes.  Neuro: Alert, oriented X3, cranial nerves II-XII grossly intact, moves all extremities normally.  Psych: Patient is not psychotic, no suicidal or hemocidal ideation.  Labs on Admission: I have personally reviewed following labs and imaging studies  CBC: Recent Labs  Lab 08/05/22 0310  WBC 13.2*  HGB 11.1*  HCT 34.3*  MCV 84.5  PLT [id number]*   Basic Metabolic Panel: Recent Labs  Lab 08/05/22 0310  NA 137  K 3.7  CL 103   CO2 25  GLUCOSE 140*  BUN 12  CREATININE 1.02*  CALCIUM 8.7*  MG 1.9   GFR: Estimated Creatinine Clearance: 69.7 mL/min (A) (by C-G formula based on SCr of 1.02 mg/dL (H)). Liver Function Tests: Recent Labs  Lab 08/05/22 0310  AST 67*  ALT 43  ALKPHOS 56  BILITOT 0.8  PROT 7.4  ALBUMIN 3.6   Recent Labs  Lab 08/05/22 0310  LIPASE 52*   No results for input(s): "AMMONIA" in the last 168 hours. Coagulation Profile: No results for input(s): "INR", "PROTIME" in the last 168 hours. Cardiac Enzymes: No results for input(s): "CKTOTAL", "CKMB", "CKMBINDEX", "TROPONINI" in the last 168 hours. BNP (last 3 results) No results for input(s): "PROBNP" in the last 8760 hours. HbA1C: No results for input(s): "HGBA1C" in the last 72 hours. CBG:  No results for input(s): "GLUCAP" in the last 168 hours. Lipid Profile: No results for input(s): "CHOL", "HDL", "LDLCALC", "TRIG", "CHOLHDL", "LDLDIRECT" in the last 72 hours. Thyroid Function Tests: No results for input(s): "TSH", "T4TOTAL", "FREET4", "T3FREE", "THYROIDAB" in the last 72 hours. Anemia Panel: No results for input(s): "VITAMINB12", "FOLATE", "FERRITIN", "TIBC", "IRON", "RETICCTPCT" in the last 72 hours. Urine analysis:    Component Value Date/Time   COLORURINE YELLOW (A) 08/05/2022 0310   APPEARANCEUR HAZY (A) 08/05/2022 0310   LABSPEC 1.029 08/05/2022 0310   PHURINE 7.0 08/05/2022 0310   GLUCOSEU NEGATIVE 08/05/2022 0310   HGBUR NEGATIVE 08/05/2022 0310   BILIRUBINUR NEGATIVE 08/05/2022 0310   BILIRUBINUR negative 03/17/2020 1643   KETONESUR NEGATIVE 08/05/2022 0310   PROTEINUR 30 (A) 08/05/2022 0310   UROBILINOGEN 0.2 03/17/2020 1643   NITRITE NEGATIVE 08/05/2022 0310   LEUKOCYTESUR TRACE (A) 08/05/2022 0310   Sepsis Labs: @LABRCNTIP$ (procalcitonin:4,lacticidven:4) )No results found for this or any previous visit (from the past 240 hour(s)).   Radiological Exams on Admission: MR ABDOMEN MRCP W WO  CONTAST  Result Date: 08/05/2022 CLINICAL DATA:  Evaluate for gallstones.  Pancreatitis. EXAM: MRI ABDOMEN WITHOUT AND WITH CONTRAST (INCLUDING MRCP) TECHNIQUE: Multiplanar multisequence MR imaging of the abdomen was performed both before and after the administration of intravenous contrast. Heavily T2-weighted images of the biliary and pancreatic ducts were obtained, and three-dimensional MRCP images were rendered by post processing. CONTRAST:  7.63m GADAVIST GADOBUTROL 1 MMOL/ML IV SOLN COMPARISON:  Ultrasound abdomen 08/05/2022.  MRI abdomen 04/29/2018 FINDINGS: Lower chest: No acute findings. Hepatobiliary: No liver mass identified. No gallstones identified. Mild gallbladder wall thickening is identified measuring up to 3.5 mm, image 22/4. The common bile duct measures up to 9 mm, image 10/12. Mild intrahepatic bile duct dilatation. Filling defects are identified within the common bile duct compatible with choledocholithiasis. The largest is in the distal third measuring 5 mm in length, image 11/12. Pancreas: No mass, inflammatory changes, or other parenchymal abnormality identified. Spleen:  Within normal limits in size and appearance. Adrenals/Urinary Tract: No masses identified. No evidence of hydronephrosis. Stomach/Bowel: Stomach appears within normal limits. No dilated bowel loops. Vascular/Lymphatic: Upper abdominal vascularity is patent. Normal caliber abdominal aorta. No adenopathy identified. Other:  No free fluid or fluid collections identified. Musculoskeletal: No suspicious bone lesions identified. IMPRESSION: 1. Choledocholithiasis with mild dilatation of the common bile duct and intrahepatic bile ducts. 2. Mild gallbladder wall thickening is identified. No stones noted within the lumen of the gallbladder. Electronically Signed   By: TKerby MoorsM.D.   On: 08/05/2022 07:27   MR 3D Recon At Scanner  Result Date: 08/05/2022 CLINICAL DATA:  Evaluate for gallstones.  Pancreatitis. EXAM: MRI  ABDOMEN WITHOUT AND WITH CONTRAST (INCLUDING MRCP) TECHNIQUE: Multiplanar multisequence MR imaging of the abdomen was performed both before and after the administration of intravenous contrast. Heavily T2-weighted images of the biliary and pancreatic ducts were obtained, and three-dimensional MRCP images were rendered by post processing. CONTRAST:  7.518mGADAVIST GADOBUTROL 1 MMOL/ML IV SOLN COMPARISON:  Ultrasound abdomen 08/05/2022.  MRI abdomen 04/29/2018 FINDINGS: Lower chest: No acute findings. Hepatobiliary: No liver mass identified. No gallstones identified. Mild gallbladder wall thickening is identified measuring up to 3.5 mm, image 22/4. The common bile duct measures up to 9 mm, image 10/12. Mild intrahepatic bile duct dilatation. Filling defects are identified within the common bile duct compatible with choledocholithiasis. The largest is in the distal third measuring 5 mm in length, image 11/12. Pancreas: No mass, inflammatory  changes, or other parenchymal abnormality identified. Spleen:  Within normal limits in size and appearance. Adrenals/Urinary Tract: No masses identified. No evidence of hydronephrosis. Stomach/Bowel: Stomach appears within normal limits. No dilated bowel loops. Vascular/Lymphatic: Upper abdominal vascularity is patent. Normal caliber abdominal aorta. No adenopathy identified. Other:  No free fluid or fluid collections identified. Musculoskeletal: No suspicious bone lesions identified. IMPRESSION: 1. Choledocholithiasis with mild dilatation of the common bile duct and intrahepatic bile ducts. 2. Mild gallbladder wall thickening is identified. No stones noted within the lumen of the gallbladder. Electronically Signed   By: Kerby Moors M.D.   On: 08/05/2022 07:27   US ABDOMEN LIMITED RUQ (LIVER/GB)  Result Date: 08/05/2022 CLINICAL DATA:  Epigastric and right upper quadrant pain with nausea and vomiting. EXAM: ULTRASOUND ABDOMEN LIMITED RIGHT UPPER QUADRANT COMPARISON:  MRI  abdomen 04/29/2018 FINDINGS: Gallbladder: Gallbladder is distended measuring 9.5 x 4.2 x 5.1 cm. The free wall is slightly thickened and measures 3.4 mm. There is no pericholecystic fluid or positive sonographic Murphy's sign. A solitary 7.5 mm linear echogenic structure is noted mobile within the lumen and could be a nonshadowing stone or a cholesterol crystal. No other suggestion of stones is seen. Common bile duct: Diameter: Prominent measuring 8.2 mm. Obscured by bowel gas distally. Visualized portion without filling defects. On the MRI the common bile duct was 5.4 mm. Laboratory and clinical correlation recommended. Liver: No focal lesion identified. Within normal limits in parenchymal echogenicity. Portal vein is patent on color Doppler imaging with normal direction of blood flow towards the liver. Other: None. IMPRESSION: 1. Distended gallbladder with mild wall thickening. No pericholecystic fluid or positive sonographic Murphy's sign. 2. A solitary 7.5 mm linear echogenic structure is noted mobile within the lumen and could be a nonshadowing stone or a cholesterol crystal. 3. Prominent common bile duct measuring 8.2 mm. On the MRI the common bile duct was 5.4 mm. The distal common bile duct is obscured by bowel gas. Visualized portion without filling defects. Electronically Signed   By: Telford Nab M.D.   On: 08/05/2022 04:47      Assessment/Plan Principal Problem:   Choledocholithiasis Active Problems:   Hypertension   Asthma   Thrombocytosis   Iron deficiency anemia   AV block, Mobitz II   Alcohol use   Class 1 obesity due to excess calories with body mass index (BMI) of 30.0 to 30.9 in adult   Assessment and Plan:  Choledocholithiasis: Liver function normal, lipase 52.  Patient has a mild leukocytosis.  No fever.  Consulted GI, Dr. Allen Norris is planning to do ERCP tomorrow  -admit to telemetry bed as inpatient -N.p.o. after midnight -Started Zosyn empirically -Blood culture -IV  fluid: 1 L normal saline, and then 100 cc/h  Hypertension -IV hydralazine as needed -Benicar/HCTZ  Asthma: Stable -As needed albuterol  Thrombocytosis: This is chronic issue.  Platelet 429.  Etiology is not clear -Follow-up with CBC  Iron deficiency anemia: Hemoglobin 11.1 -Continue iron Supplement  AV block, Mobitz II: Patient had 1 episode of Mobitz 2 AV block.  No history of this issue. -Consulted with Dr. Sophronia Simas of cardiology -Tele monitoring  Alcohol use: No signs of withdrawal -Did counseling about importance of cutting down alcohol drinking -Watch any signs of withdrawal  Class 1 obesity due to excess calories with body mass index (BMI) of 30.0 to 30.9 in adult: Body weight 85.6, BMI 30.42 -Healthy diet and exercise -Encouraged losing weight     DVT ppx: SCD  Code Status:  Full code  Family Communication: I offered to call her family, but patient states that she already texted to her boyfriend.  She states that I do need to call her family.  Disposition Plan:  Anticipate discharge back to previous environment  Consults called:  Dr. Haig Prophet of GI is consulted by EDP, who will discuss with Dr. Allen Norris for possible ERCP.  Consulted Dr. Fletcher Anon of cardiology.  Admission status and Level of care: Telemetry Medical:    as inpt      Dispo: The patient is from: Home              Anticipated d/c is to: Home              Anticipated d/c date is: 2 days              Patient currently is not medically stable to d/c.    Severity of Illness:  The appropriate patient status for this patient is INPATIENT. Inpatient status is judged to be reasonable and necessary in order to provide the required intensity of service to ensure the patient's safety. The patient's presenting symptoms, physical exam findings, and initial radiographic and laboratory data in the context of their chronic comorbidities is felt to place them at high risk for further clinical deterioration. Furthermore, it  is not anticipated that the patient will be medically stable for discharge from the hospital within 2 midnights of admission.   * I certify that at the point of admission it is my clinical judgment that the patient will require inpatient hospital care spanning beyond 2 midnights from the point of admission due to high intensity of service, high risk for further deterioration and high frequency of surveillance required.*       Date of Service 08/05/2022    Ivor Costa [facility]   If 7PM-7AM, please contact night-coverage [email or web address] 08/05/2022, 11:33 AM

## 2022-08-05 NOTE — Consult Note (Signed)
Cardiology Consultation   Patient ID: Madison Conway MRN: [id number]; DOB: [birth year]  Admit date: 08/05/2022 Date of Consult: 08/05/2022  PCP:  Jearld Fenton, NP   [place] Providers Cardiologist:  None      New consult done by Dr. Fletcher Anon  Patient Profile:   Madison Conway is a 52 y.o. female with a hx of hypertension, asthma, GERD, anemia, obesity, and history of gallstones, who is being seen 08/05/2022 for the evaluation of abnormal EKG at the request of Dr. Blaine Hamper.  History of Present Illness:   Madison Conway 52 year old female who presents with a past medical history of hypertension controlled on medication, asthma, gastroesophageal reflux disease, anemia, obesity, and a history of gallstones.  She presented to the [facility] emergency department with complaints of abdominal pain with acute onset to her right upper quadrant associated with violent nausea and vomiting.  She states that she had pain under the right breast that wrapped around to her right back and shoulder.  She stated that she has been told that she has had gallstones in the past and has had several gallbladder attacks 1 [place].  She says typically her attacks are related to acidic foods but last evening she had had [place] food and 1 mixed drink.  The next couple of hours is when she developed the pain which had persisted.  She had vomited a couple of times and continued to vomit while she was in triage.  She denied any other symptoms of fever, chest pain, shortness of breath, lightheadedness or dizziness, and lower abdominal pain.  Initial vitals: Blood pressure 149/101, pulse of 89, respirations of 20, temperature of 97.7  Pertinent labs: Lipase 52, blood glucose 140, serum creatinine 1.02, calcium 8.7, AST 67, WBCs of 13.2, hemoglobin 11.1, hematocrit 43.4, platelets of 429, high-sensitivity troponins negative x 2, urine pregnancy test was  negative  Imaging: Abdominal ultrasound the right upper quadrant revealed distended gallbladder with mild wall thickening, positive sonographic Murphy sign, and solitary 7.5 mm linear echogenic structures noted mobile within the lumen and could be nonshadowing stone or cholesterol crystal coming prominent common bile duct measuring 8.2 mm on MRI the common bile duct is 5.4 mm, the distal bile duct is obscured by bowel gas; MRCP reveals choledocholithiasis with mild dilatation of the common bile duct and intrahepatic bile ducts, mild gallbladder wall thickening  Medications administered in the emergency department normal saline infusion, Zofran 4 mg IVP, Toradol 30 mg IV, morphine 4 mg IVP  During an episode of vomiting and in between episodes that she had an EKG that was done which revealed type II AV block or Mobitz type II heart block thus cardiology was consulted.  Since that time telemetry monitoring repeat EKG reveals sinus rhythm with rates in the 70s.   Past Medical History:  Diagnosis Date   Asthma    childhood.    Past Surgical History:  Procedure Laterality Date   COLONOSCOPY WITH PROPOFOL N/A 05/10/2021   Procedure: COLONOSCOPY WITH PROPOFOL;  Surgeon: Virgel Manifold, MD;  Location: [facility] ENDOSCOPY;  Service: Endoscopy;  Laterality: N/A;   NO PAST SURGERIES       Home Medications:  Prior to Admission medications   Medication Sig Start Date End Date Taking? Authorizing Provider  ALAYCEN 1/35 tablet TAKE 1 TABLET BY MOUTH EVERY DAY 03/25/22  Yes Jearld Fenton, NP  famotidine (PEPCID) 10 MG tablet Take 10 mg  by mouth daily as needed for heartburn or indigestion.   Yes [provider]  ferrous sulfate (FER-IN-SOL) 75 (15 Fe) MG/ML SOLN Take 15 mg of iron by mouth 3 (three) times a week.    Yes [provider]  Multiple Vitamins-Minerals (MULTIVITAMIN ADULT PO) Take 1 tablet by mouth every other day.    Yes [provider]   olmesartan-hydrochlorothiazide (BENICAR HCT) 20-12.5 MG tablet TAKE 1 TABLET BY MOUTH EVERY DAY 03/25/22  Yes Jearld Fenton, NP    Inpatient Medications: Scheduled Meds:  Continuous Infusions:  sodium chloride 100 mL/hr at 08/05/22 0839   piperacillin-tazobactam (ZOSYN)  IV 3.375 g (08/05/22 0839)   PRN Meds: acetaminophen, albuterol, dextromethorphan-guaiFENesin, hydrALAZINE, HYDROmorphone (DILAUDID) injection, ondansetron (ZOFRAN) IV, oxyCODONE-acetaminophen  Allergies:   No Known Allergies  Social History:   Social History   Socioeconomic History   Marital status: Single    Spouse name: Not on file   Number of children: Not on file   Years of education: Not on file   Highest education level: Not on file  Occupational History   Not on file  Tobacco Use   Smoking status: Never   Smokeless tobacco: Never  Vaping Use   Vaping Use: Never used  Substance and Sexual Activity   Alcohol use: Yes    Alcohol/week: 2.0 - 3.0 standard drinks of alcohol    Types: 2 - 3 Standard drinks or equivalent per week   Drug use: No   Sexual activity: Yes    Birth control/protection: Pill  Other Topics Concern   Not on file  Social History Narrative   Not on file   Social Determinants of Health   Financial Resource Strain: Not on file  Food Insecurity: Not on file  Transportation Needs: Not on file  Physical Activity: Not on file  Stress: Not on file  Social Connections: Not on file  Intimate Partner Violence: Not on file    Family History:    Family History  Problem Relation Age of Onset   Diabetes Maternal Grandmother    Breast cancer Neg Hx    Colon cancer Neg Hx    Ovarian cancer Neg Hx      ROS:  Please see the history of present illness.  Review of Systems  Gastrointestinal:  Positive for abdominal pain, nausea and vomiting.     All other ROS reviewed and negative.     Physical Exam/Data:   Vitals:   08/05/22 0600 08/05/22 0615 08/05/22 0722 08/05/22 0730   BP:   130/76 130/74  Pulse: 66 65 77 64  Resp: 18 18 18 18  $ Temp:      TempSrc:      SpO2: 100% 100% 100% 100%  Weight:      Height:       No intake or output data in the 24 hours ending 08/05/22 0950    08/05/2022    3:07 AM 03/05/2022    8:06 AM 05/31/2021    3:47 PM  Last 3 Weights  Weight (lbs) 180 lb 181 lb 182 lb  Weight (kg) 81.647 kg 82.101 kg 82.555 kg     Body mass index is 30.42 kg/m.  General:  Well nourished, well developed, in no acute distress HEENT: normal Neck: no JVD Vascular: No carotid bruits; Distal pulses 2+ bilaterally Cardiac:  normal S1, S2; RRR; no murmur  Lungs:  clear to auscultation bilaterally, no wheezing, rhonchi or rales, respirations are unlabored at rest on room air Abd:  soft, mild tenderness to the right upper quadrant and right side, no hepatomegaly  Ext: no edema Musculoskeletal:  No deformities, BUE and BLE strength normal and equal Skin: warm and dry  Neuro:  CNs 2-12 intact, no focal abnormalities noted Psych:  Normal affect   EKG:  The EKG was personally reviewed and demonstrates: Initial EKG revealed movements x 2 with a heart rate 38 bpm repeat EKG revealed sinus with a rate of 76 and LVH Telemetry:  Telemetry was personally reviewed and demonstrates: Sinus rhythm with LVH with rates of 60-70 without any further episodes type II AVB  Relevant CV Studies: none  Laboratory Data:  High Sensitivity Troponin:   Recent Labs  Lab 08/05/22 0310 08/05/22 0721  TROPONINIHS 3 3     Chemistry Recent Labs  Lab 08/05/22 0310  NA 137  K 3.7  CL 103  CO2 25  GLUCOSE 140*  BUN 12  CREATININE 1.02*  CALCIUM 8.7*  MG 1.9  GFRNONAA >60  ANIONGAP 9    Recent Labs  Lab 08/05/22 0310  PROT 7.4  ALBUMIN 3.6  AST 67*  ALT 43  ALKPHOS 56  BILITOT 0.8   Lipids No results for input(s): "CHOL", "TRIG", "HDL", "LABVLDL", "LDLCALC", "CHOLHDL" in the last 168 hours.  Hematology Recent Labs  Lab 08/05/22 0310  WBC 13.2*  RBC  4.06  HGB 11.1*  HCT 34.3*  MCV 84.5  MCH 27.3  MCHC 32.4  RDW 14.8  PLT 429*   Thyroid No results for input(s): "TSH", "FREET4" in the last 168 hours.  BNPNo results for input(s): "BNP", "PROBNP" in the last 168 hours.  DDimer No results for input(s): "DDIMER" in the last 168 hours.   Radiology/Studies:  MR ABDOMEN MRCP W WO CONTAST  Result Date: 08/05/2022 CLINICAL DATA:  Evaluate for gallstones.  Pancreatitis. EXAM: MRI ABDOMEN WITHOUT AND WITH CONTRAST (INCLUDING MRCP) TECHNIQUE: Multiplanar multisequence MR imaging of the abdomen was performed both before and after the administration of intravenous contrast. Heavily T2-weighted images of the biliary and pancreatic ducts were obtained, and three-dimensional MRCP images were rendered by post processing. CONTRAST:  7.36m GADAVIST GADOBUTROL 1 MMOL/ML IV SOLN COMPARISON:  Ultrasound abdomen 08/05/2022.  MRI abdomen 04/29/2018 FINDINGS: Lower chest: No acute findings. Hepatobiliary: No liver mass identified. No gallstones identified. Mild gallbladder wall thickening is identified measuring up to 3.5 mm, image 22/4. The common bile duct measures up to 9 mm, image 10/12. Mild intrahepatic bile duct dilatation. Filling defects are identified within the common bile duct compatible with choledocholithiasis. The largest is in the distal third measuring 5 mm in length, image 11/12. Pancreas: No mass, inflammatory changes, or other parenchymal abnormality identified. Spleen:  Within normal limits in size and appearance. Adrenals/Urinary Tract: No masses identified. No evidence of hydronephrosis. Stomach/Bowel: Stomach appears within normal limits. No dilated bowel loops. Vascular/Lymphatic: Upper abdominal vascularity is patent. Normal caliber abdominal aorta. No adenopathy identified. Other:  No free fluid or fluid collections identified. Musculoskeletal: No suspicious bone lesions identified. IMPRESSION: 1. Choledocholithiasis with mild dilatation of the  common bile duct and intrahepatic bile ducts. 2. Mild gallbladder wall thickening is identified. No stones noted within the lumen of the gallbladder. Electronically Signed   By: TKerby MoorsM.D.   On: 08/05/2022 07:27   MR 3D Recon At Scanner  Result Date: 08/05/2022 CLINICAL DATA:  Evaluate for gallstones.  Pancreatitis. EXAM: MRI ABDOMEN WITHOUT AND WITH CONTRAST (INCLUDING MRCP) TECHNIQUE: Multiplanar multisequence MR imaging of the abdomen was performed both  before and after the administration of intravenous contrast. Heavily T2-weighted images of the biliary and pancreatic ducts were obtained, and three-dimensional MRCP images were rendered by post processing. CONTRAST:  7.51m GADAVIST GADOBUTROL 1 MMOL/ML IV SOLN COMPARISON:  Ultrasound abdomen 08/05/2022.  MRI abdomen 04/29/2018 FINDINGS: Lower chest: No acute findings. Hepatobiliary: No liver mass identified. No gallstones identified. Mild gallbladder wall thickening is identified measuring up to 3.5 mm, image 22/4. The common bile duct measures up to 9 mm, image 10/12. Mild intrahepatic bile duct dilatation. Filling defects are identified within the common bile duct compatible with choledocholithiasis. The largest is in the distal third measuring 5 mm in length, image 11/12. Pancreas: No mass, inflammatory changes, or other parenchymal abnormality identified. Spleen:  Within normal limits in size and appearance. Adrenals/Urinary Tract: No masses identified. No evidence of hydronephrosis. Stomach/Bowel: Stomach appears within normal limits. No dilated bowel loops. Vascular/Lymphatic: Upper abdominal vascularity is patent. Normal caliber abdominal aorta. No adenopathy identified. Other:  No free fluid or fluid collections identified. Musculoskeletal: No suspicious bone lesions identified. IMPRESSION: 1. Choledocholithiasis with mild dilatation of the common bile duct and intrahepatic bile ducts. 2. Mild gallbladder wall thickening is identified. No  stones noted within the lumen of the gallbladder. Electronically Signed   By: TKerby MoorsM.D.   On: 08/05/2022 07:27   [place]DOM[place] LIMITED RUQ (LIVER/GB)  Result Date: 08/05/2022 CLINICAL DATA:  Epigastric and right upper quadrant pain with nausea and vomiting. EXAM: ULTRASOUND ABDOMEN LIMITED RIGHT UPPER QUADRANT COMPARISON:  MRI abdomen 04/29/2018 FINDINGS: Gallbladder: Gallbladder is distended measuring 9.5 x 4.2 x 5.1 cm. The free wall is slightly thickened and measures 3.4 mm. There is no pericholecystic fluid or positive sonographic Murphy's sign. A solitary 7.5 mm linear echogenic structure is noted mobile within the lumen and could be a nonshadowing stone or a cholesterol crystal. No other suggestion of stones is seen. Common bile duct: Diameter: Prominent measuring 8.2 mm. Obscured by bowel gas distally. Visualized portion without filling defects. On the MRI the common bile duct was 5.4 mm. Laboratory and clinical correlation recommended. Liver: No focal lesion identified. Within normal limits in parenchymal echogenicity. Portal vein is patent on color Doppler imaging with normal direction of blood flow towards the liver. Other: None. IMPRESSION: 1. Distended gallbladder with mild wall thickening. No pericholecystic fluid or positive sonographic Murphy's sign. 2. A solitary 7.5 mm linear echogenic structure is noted mobile within the lumen and could be a nonshadowing stone or a cholesterol crystal. 3. Prominent common bile duct measuring 8.2 mm. On the MRI the common bile duct was 5.4 mm. The distal common bile duct is obscured by bowel gas. Visualized portion without filling defects. Electronically Signed   By: KTelford NabM.D.   On: 08/05/2022 04:47     Assessment and Plan:   Abnormal EKG -Patient was noted to have transient type II AV block on arrival duration active vomiting -Remains asymptomatic -Since that time episodes have resolved -Patient has no cardiac history other than  hypertension -Repeat EKGs and telemetry monitoring reveals sinus rhythm with rates in the 60s and 70s -Likely vasovagal episode due as described by the patient as violet vomiting -Continue with telemetry monitoring -Continue to avoid AV nodal blocking agents -If continued episodes can consider ZIO XT on discharge  Right upper quadrant abdominal pain with MRCP revealing choledocholithiasis -Patient states she has previous history of being told that she has gallstones in the past without recent flare -Continues to have right upper quadrant  abdominal pain -EDP has reached out to GI -Continue with pain management and hydration as well as empiric antibiotic therapy -Management per IM  Hypertension -Blood pressure 123/75 -PTA on losartan HCTZ 20/12.5 is currently on hold -Blood pressure remained stable history is receiving pain medication -Vital signs per unit protocol    Risk Assessment/Risk Scores:                For questions or updates, please contact [place] Please consult [email or web address] for contact info under    Signed, Corneshia Hines, NP  08/05/2022 9:50 AM

## 2022-08-05 NOTE — ED Provider Notes (Signed)
[facility] Provider Note    Event Date/Time   First MD Initiated Contact with Patient 08/05/22 [phone]     (approximate)   History   Abdominal Pain   HPI  Madison Conway is a 52 y.o. female who presents for evaluation of acute onset pain in her right upper quadrant associated with nausea and vomiting.  She reports that she has had similar problems in the past but it has not been so severe.  She was told in the past that she had gallstones but "I never did anything about it".  She had some [place] and [place] ice tea.  Within the next couple of hours she developed the pain which has been persistent.  She has vomited multiple times.  She was still vomiting when she arrived to triage.  She denies fever, chest pain, shortness of breath, lightheadedness (except when she is actively vomiting), and lower abdominal pain.     Physical Exam   Triage Vital Signs: ED Triage Vitals [08/05/22 0307]  Enc Vitals Group     BP (!) 149/101     Pulse Rate 89     Resp 20     Temp 97.7 F (36.5 C)     Temp Source Oral     SpO2 100 %     Weight 81.6 kg (180 lb)     Height 1.638 m (5' 4.5")     Head Circumference      Peak Flow      Pain Score 8     Pain Loc      Pain Edu?      Excl. in [place]?     Most recent vital signs: Vitals:   08/05/22 0615 08/05/22 0722  BP:  130/76  Pulse: 65 77  Resp: 18 18  Temp:    SpO2: 100% 100%     General: Awake, appears a bit uncomfortable but otherwise no distress. CV:  Good peripheral perfusion.  Normal heart sounds and regular rate and rhythm at the time of my assessment. Resp:  Normal effort. Speaking easily and comfortably, no accessory muscle usage nor intercostal retractions.  Lungs are clear to auscultation bilaterally. Abd:  No distention.  Patient has mild tenderness to palpation of the lower abdomen but it is actually referred pain up into the upper abdomen.  No guarding.  She is exquisitely tender  to palpation of the epigastrium and right upper quadrant with positive Murphy sign. Other:  No focal neurological deficits.  Patient is awake and alert and oriented x 3.   ED Results / Procedures / Treatments   Labs (all labs ordered are listed, but only abnormal results are displayed) Labs Reviewed  LIPASE, BLOOD - Abnormal; Notable for the following components:      Result Value   Lipase 52 (*)    All other components within normal limits  COMPREHENSIVE METABOLIC PANEL - Abnormal; Notable for the following components:   Glucose, Bld 140 (*)    Creatinine, Ser 1.02 (*)    Calcium 8.7 (*)    AST 67 (*)    All other components within normal limits  CBC - Abnormal; Notable for the following components:   WBC 13.2 (*)    Hemoglobin 11.1 (*)    HCT 34.3 (*)    Platelets 429 (*)    All other components within normal limits  MAGNESIUM  URINALYSIS, ROUTINE W REFLEX MICROSCOPIC  POC URINE PREG, ED  TROPONIN I (  HIGH SENSITIVITY)  TROPONIN I (HIGH SENSITIVITY)     EKG  ED ECG REPORT #1 I, Hinda Kehr, the attending physician, personally viewed and interpreted this ECG.  Date: 08/05/2022 EKG Time: 3:10 AM Rate: 38 Rhythm: Sinus rhythm with second-degree AV block type II with 2-1 conduction QRS Axis: normal Intervals: Abnormal due to regular nonconducted P waves ST/T Wave abnormalities: Non-specific ST segment / T-wave changes, but no clear evidence of acute ischemia. Narrative Interpretation: no definitive evidence of acute ischemia; does not meet STEMI criteria.  ED ECG REPORT #2 I, Hinda Kehr, the attending physician, personally viewed and interpreted this ECG.  Date: 08/05/2022 EKG Time: 3:52 AM Rate: 76 Rhythm: normal sinus rhythm QRS Axis: normal Intervals: normal ST/T Wave abnormalities: Non-specific ST segment / T-wave changes, but no clear evidence of acute ischemia.  There are some inverted T waves in lead III, but otherwise it is generally  reassuring. Narrative Interpretation: no definitive evidence of acute ischemia; does not meet STEMI criteria.    RADIOLOGY See hospital course for details: Abnormal ultrasound, borderline for cholecystitis but without clear indication, also indicates dilated common bile duct.    PROCEDURES:  Critical Care performed: No  .1-3 Lead EKG Interpretation  Performed by: Hinda Kehr, MD Authorized by: Hinda Kehr, MD     Interpretation: normal     ECG rate:  88   ECG rate assessment: normal     Rhythm: sinus rhythm     Ectopy: none     Conduction: normal      MEDICATIONS ORDERED IN ED: Medications  0.9 %  sodium chloride infusion (has no administration in time range)  cefTRIAXone (ROCEPHIN) 2 g in sodium chloride 0.9 % 100 mL IVPB (has no administration in time range)  metroNIDAZOLE (FLAGYL) IVPB 500 mg (has no administration in time range)  HYDROmorphone (DILAUDID) injection 0.5 mg (has no administration in time range)  morphine (PF) 4 MG/ML injection 4 mg (4 mg Intravenous Given 08/05/22 0405)  ketorolac (TORADOL) 30 MG/ML injection 15 mg (15 mg Intravenous Given 08/05/22 0404)  ondansetron (ZOFRAN) injection 4 mg (4 mg Intravenous Given 08/05/22 0400)  lactated ringers bolus 1,000 mL (0 mLs Intravenous Stopped 08/05/22 0622)  gadobutrol (GADAVIST) 1 MMOL/ML injection 7.5 mL (7.5 mLs Intravenous Contrast Given 08/05/22 0704)     IMPRESSION / MDM / ASSESSMENT AND PLAN / ED COURSE  I reviewed the triage vital signs and the nursing notes.                              Differential diagnosis includes, but is not limited to, biliary colic, cholecystitis, choledocholithiasis, pancreatitis, new onset heart block, ACS, PE.  Patient's presentation is most consistent with acute presentation with potential threat to life or bodily function.  Vital signs are notable for an initial EKG heart rate of 38 and an EKG consistent with type II secondary heart block.  This was brought to my  attention immediately and the patient was brought to a room within minutes.  I saw the patient within minutes as well.  By the time I saw her, I could palpate and auscultate a regular rate and rhythm.    I will err on the side of caution and put the patient on the Zoll, and we will repeat an EKG, but she is no longer bradycardic.  Her physical exam is very consistent with gallbladder disease.  Lab work and imaging as listed below are pending.  We will observe carefully while treating with medications as listed below and providing evaluation.  The patient is on the cardiac monitor to evaluate for evidence of arrhythmia and/or significant heart rate changes.   Labs/studies ordered: Magnesium (given the abnormal EKG), high-sensitivity troponin, CMP, lipase, CBC, right upper quadrant ultrasound, EKG x 2, cardiac monitoring. Interventions/Medications given: Morphine 4 mg IV, Toradol 15 mg IV, Zofran 4 mg IV, LR 1 L IV bolus. [facility] Course my include additional interventions not listed in this section:)   Clinical Course as of 08/05/22 0742  [facility] Aug 05, 2022  [id number] Repeat EKG shows normal sinus rhythm, which is consistent with the findings of my physical exam.  We will continue to monitor and leave her on the Zoll, but it seems like this may have been a transient episode.  She reports that she was bearing down and vomiting when they were taking the EKG.  It seems unlikely to me that this would have put the patient in a type II second-degree heart block, but it may be possible. [CF]  0359 Mild leukocytosis of 13.2 [CF]  0412 Lipase(!): 52 Mildly elevated lipase, could be from the vomiting or could represent early pancreatitis [CF]  0412 Comprehensive metabolic panel(!) Essentially normal CMP, very slightly elevated creatinine, could represent early evidence of volume depletion.  I am ordering LR 1 L IV bolus.  AST is slightly elevated at 67. [CF]  0611 I viewed and interpreted the patient's right  upper quadrant ultrasound.  It seems that she has some dilatation of the common bile duct.  The gallbladder wall is distended but without obvious cholecystitis.  I reassessed the patient and she feels much better after the medicines but she is still quite tender to palpation.  Given the mild AST elevation, mild lipase elevation, history of biliary colic but with worsening and persistent pain and tenderness at this time, and with what appears to be a dilated common bile duct, I talked with her about MRCP and she agrees to obtaining this scan to rule out choledocholithiasis.  It will also give [place] a better look at the pancreas and gallbladder.  She does not require additional medications at this point.  I have ordered the MRCP.  I will transfer ED care to Dr. Ellender Hose at 7 AM to follow-up on the results and disposition appropriately. [CF]  0710 Discussed case in detail with Dr. Ellender Hose.  He will follow-up on the MRCP and disposition the patient appropriately.  The conversation I had with the patient as well as with Dr. Ellender Hose regarding her transient heart block is that the patient should follow-up with cardiology but does not need to stay in the hospital for this 1 abnormal EKG with no symptoms and complete resolution of the issue.  If the patient is able to go home, I recommend cardiology referral.  If she requires admission, perhaps the hospitalist or surgical team can consult cardiology and explain the initial abnormal EKG. [CF]    Clinical Course User Index [CF] Hinda Kehr, MD     FINAL CLINICAL IMPRESSION(S) / ED DIAGNOSES   Final diagnoses:  RUQ pain  Nausea and vomiting, unspecified vomiting type  Abnormal ECG     Rx / DC Orders   ED Discharge Orders     None        Note:  This document was prepared using Dragon voice recognition software and may include unintentional dictation errors.   Hinda Kehr, MD 08/05/22 [phone]

## 2022-08-05 NOTE — ED Notes (Signed)
Patient transported to MRI 

## 2022-08-05 NOTE — Consult Note (Signed)
Consultation  Referring Provider:     Dr Ellender Hose Admit date 08/05/22 Consult date        08/05/22 Reason for Consultation:    choledocholithiasis         HPI:   Madison Conway is a 52 y.o. female  with a hx of hypertension, asthma, GERD, anemia, obesity, and history of gallstones who presented to ED very early this am with acute onset RUQ pain/NVD a couple hrs after eating [place] food  with an alcoholic beverage last night. Cardiology was consulted as some mobitz type II AVB during & around the time she was vomiting in the ED. This seems to have resolved without treatment and she continues to be monitored- zio xt is under consideration if it happens again.  She was found to have a possible gallstone and a prominent CBD but incompletely evaluated  CBD so had MRCP with the  IMPRESSION: 1. Choledocholithiasis with mild dilatation of the common bile duct and intrahepatic bile ducts. 2. Mild gallbladder wall thickening is identified. No stones noted within the lumen of the gallbladder. GI has been consulted to help with stone mgmt. She has been started on zosyn. Gfr normal,  mild leukocytosis, hgb 11.2. lipase 52, ast 67 otherwise liver enzymes normal.   Patient reports she was well yesterday until symptoms developed as above about 1030pm- vomiting started about 1230a and stopped after she came to the ED. States she is much better now and has no pain/dyspepsia/diarrhea. States she has had some intermittent episodes of milder pain/dyspepsia/diarrhea but has never vomited like that in past. Denies any melena/hematochezia/acholic stools. States that she had several colonsocopies/egds around the age of 14-/15 and as a teen to young adult and told she had crohns- treated intermittently with some type of steroid pills and symptoms would improve. States she outgrew this problem and has not had many symptoms since. Was seen by Dr Sandria Manly for colonosopy 2022 as below - states she just did not follow up as  she has been well and did not understand why it was necessary. We discussed crohns/UC/IBD some- that it is a chronic issue that is managed/not cured. Did give rationale for continued GI f/u with a Gi provider of her choice.  PREVIOUS ENDOSCOPIES:             Colonoscopy 2022/screening- Dr Sandria Manly- ulcerated transverse colonic mucosa, erythematous descending colon. No polyps. DIAGNOSIS:  A. COLON, TRANSVERSE; COLD BIOPSY:  - POLYPOID FRAGMENT OF BENIGN COLONIC MUCOSA WITH SIGNIFICANT CHRONIC  INFLAMMATORY CHANGES AND FOCAL ACTIVE MUCOSAL COLITIS (CRYPTITIS).  - PROMINENT REACTIVE APPEARING LYMPHOID AGGREGATES  - NEGATIVE FOR DYSPLASIA AND MALIGNANCY.  B. COLON POLYP, DESCENDING; COLD BIOPSY:  - POLYPOID FRAGMENT OF BENIGN COLONIC MUCOSA WITH SIGNIFICANT CHRONIC  INFLAMMATORY CHANGES AND FOCAL ACTIVE MUCOSAL COLITIS (CRYPTITIS).  - PROMINENT REACTIVE APPEARING LYMPHOID AGGREGATES  - NEGATIVE FOR DYSPLASIA AND MALIGNANCY.  There was concern for possible IBD v self limited colitis/medication effect. Fecal calprotectin was negative 12/22, crp/esr normal. Cbc unremarkable other than minimal thrombocytosis. Serum iron and ferritin normal. At the time of her GI follow up visit there was a report of being told she had some colitis as a child but never diagnosed with chronic issue- prior records were requested and she was to avoid nsaids. Clinically she was asymptomatic so was to  follow up for further mgmt but it does not appear that she did. I do not have her prior endoscopy reports  Past Medical History:  Diagnosis Date  Asthma    childhood.   HTN (hypertension)    Thrombocytosis     Past Surgical History:  Procedure Laterality Date   COLONOSCOPY WITH PROPOFOL N/A 05/10/2021   Procedure: COLONOSCOPY WITH PROPOFOL;  Surgeon: Virgel Manifold, MD;  Location: [facility] ENDOSCOPY;  Service: Endoscopy;  Laterality: N/A;   NO PAST SURGERIES      Family History  Problem Relation Age of Onset    Diabetes Maternal Grandmother    Breast cancer Neg Hx    Colon cancer Neg Hx    Ovarian cancer Neg Hx      Social History   Tobacco Use   Smoking status: Never   Smokeless tobacco: Never  Vaping Use   Vaping Use: Never used  Substance Use Topics   Alcohol use: Yes    Alcohol/week: 2.0 - 3.0 standard drinks of alcohol    Types: 2 - 3 Standard drinks or equivalent per week   Drug use: No    Prior to Admission medications   Medication Sig Start Date End Date Taking? Authorizing Provider  ALAYCEN 1/35 tablet TAKE 1 TABLET BY MOUTH EVERY DAY 03/25/22  Yes Baity, Coralie Keens, NP  famotidine (PEPCID) 10 MG tablet Take 10 mg by mouth daily as needed for heartburn or indigestion.   Yes [provider]  ferrous sulfate (FER-IN-SOL) 75 (15 Fe) MG/ML SOLN Take 15 mg of iron by mouth 3 (three) times a week.    Yes [provider]  Multiple Vitamins-Minerals (MULTIVITAMIN ADULT PO) Take 1 tablet by mouth every other day.    Yes [provider]  olmesartan-hydrochlorothiazide (BENICAR HCT) 20-12.5 MG tablet TAKE 1 TABLET BY MOUTH EVERY DAY 03/25/22  Yes Baity, Coralie Keens, NP    Current Facility-Administered Medications  Medication Dose Route Frequency Provider Last Rate Last Admin   0.9 %  sodium chloride infusion   Intravenous Continuous Duffy Bruce, MD 100 mL/hr at 08/05/22 0839 New Bag at 08/05/22 0839   acetaminophen (TYLENOL) tablet 650 mg  650 mg Oral Q6H PRN Ivor Costa, MD       albuterol (PROVENTIL) (2.5 MG/3ML) 0.083% nebulizer solution 3 mL  3 mL Inhalation Q4H PRN Ivor Costa, MD       dextromethorphan-guaiFENesin ([place] DM) 30-600 MG per 12 hr tablet 1 tablet  1 tablet Oral BID PRN Ivor Costa, MD       famotidine (PEPCID) tablet 10 mg  10 mg Oral Daily PRN Ivor Costa, MD       ferrous sulfate tablet 325 mg  325 mg Oral Once per day on Mon Wed Fri Niu, Xilin, MD       folic acid (FOLVITE) tablet 1 mg  1 mg Oral Daily Ivor Costa, MD       hydrALAZINE  (APRESOLINE) injection 5 mg  5 mg Intravenous Q2H PRN Ivor Costa, MD       irbesartan (AVAPRO) tablet 150 mg  150 mg Oral Daily Ivor Costa, MD       And   hydrochlorothiazide (HYDRODIURIL) tablet 12.5 mg  12.5 mg Oral Daily Ivor Costa, MD       HYDROmorphone (DILAUDID) injection 1 mg  1 mg Intravenous Q3H PRN Ivor Costa, MD       Derrill Memo ON 08/06/2022] multivitamin with minerals tablet 1 tablet  1 tablet Oral Henreitta Cea, MD       ondansetron [facility]) injection 4 mg  4 mg Intravenous Q8H PRN Ivor Costa, MD       oxyCODONE-acetaminophen (  PERCOCET/ROXICET) 5-325 MG per tablet 1 tablet  1 tablet Oral Q4H PRN Ivor Costa, MD   1 tablet at 08/05/22 1054   piperacillin-tazobactam (ZOSYN) IVPB 3.375 g  3.375 g Intravenous Q8H Alison Murray, RPH   Stopped at 08/05/22 1051   thiamine (VITAMIN B1) tablet 100 mg  100 mg Oral Daily Ivor Costa, MD       Current Outpatient Medications  Medication Sig Dispense Refill   ALAYCEN 1/35 tablet TAKE 1 TABLET BY MOUTH EVERY DAY 28 tablet 11   famotidine (PEPCID) 10 MG tablet Take 10 mg by mouth daily as needed for heartburn or indigestion.     ferrous sulfate (FER-IN-SOL) 75 (15 Fe) MG/ML SOLN Take 15 mg of iron by mouth 3 (three) times a week.      Multiple Vitamins-Minerals (MULTIVITAMIN ADULT PO) Take 1 tablet by mouth every other day.      olmesartan-hydrochlorothiazide (BENICAR HCT) 20-12.5 MG tablet TAKE 1 TABLET BY MOUTH EVERY DAY 90 tablet 1    Allergies as of 08/05/2022   (No Known Allergies)     Review of Systems:    All systems reviewed and negative except where noted in HPI.     Physical Exam:  Vital signs in last 24 hours: Temp:  [97.7 F (36.5 C)-98.6 F (37 C)] 98.6 F (37 C) (02/12 1100) Pulse Rate:  [61-89] 88 (02/12 1100) Resp:  [15-20] 19 (02/12 1100) BP: (123-149)/(74-101) 131/76 (02/12 1100) SpO2:  [100 %] 100 % (02/12 1100) Weight:  [81.6 kg] 81.6 kg (02/12 0307)   General:   Pleasant young woman in NAD Head:   Normocephalic and atraumatic. Eyes:   No icterus.   Conjunctiva pink. Ears:  Normal auditory acuity. Mouth: Mucosa pink moist, no lesions. Neck:  Supple; no masses felt Lungs:  Respirations even and unlabored. Lungs clear to auscultation bilaterally.   No wheezes, crackles, or rhonchi.  Heart:  S1S2, RRR, no MRG. No edema. Abdomen:   Flat, soft, nondistended, nontender. Normal bowel sounds. No appreciable masses or hepatomegaly. No rebound signs or other peritoneal signs. Rectal:  Not performed.  Msk:  MAEW x4, No clubbing or cyanosis. Strength 5/5. Symmetrical without gross deformities. Neurologic:  Alert and  oriented x4;  Cranial nerves II-XII intact.  Skin:  Warm, dry, pink without significant lesions or rashes. Psych:  Alert and cooperative. Normal affect.  LAB RESULTS: Recent Labs    08/05/22 0310  WBC 13.2*  HGB 11.1*  HCT 34.3*  PLT 429*   BMET Recent Labs    08/05/22 0310  NA 137  K 3.7  CL 103  CO2 25  GLUCOSE 140*  BUN 12  CREATININE 1.02*  CALCIUM 8.7*   LFT Recent Labs    08/05/22 0310  PROT 7.4  ALBUMIN 3.6  AST 67*  ALT 43  ALKPHOS 56  BILITOT 0.8   PT/INR No results for input(s): "LABPROT", "INR" in the last 72 hours.  STUDIES: MR ABDOMEN MRCP W WO CONTAST  Result Date: 08/05/2022 CLINICAL DATA:  Evaluate for gallstones.  Pancreatitis. EXAM: MRI ABDOMEN WITHOUT AND WITH CONTRAST (INCLUDING MRCP) TECHNIQUE: Multiplanar multisequence MR imaging of the abdomen was performed both before and after the administration of intravenous contrast. Heavily T2-weighted images of the biliary and pancreatic ducts were obtained, and three-dimensional MRCP images were rendered by post processing. CONTRAST:  7.71m GADAVIST GADOBUTROL 1 MMOL/ML IV SOLN COMPARISON:  Ultrasound abdomen 08/05/2022.  MRI abdomen 04/29/2018 FINDINGS: Lower chest: No acute findings. Hepatobiliary: No liver mass  identified. No gallstones identified. Mild gallbladder wall thickening is  identified measuring up to 3.5 mm, image 22/4. The common bile duct measures up to 9 mm, image 10/12. Mild intrahepatic bile duct dilatation. Filling defects are identified within the common bile duct compatible with choledocholithiasis. The largest is in the distal third measuring 5 mm in length, image 11/12. Pancreas: No mass, inflammatory changes, or other parenchymal abnormality identified. Spleen:  Within normal limits in size and appearance. Adrenals/Urinary Tract: No masses identified. No evidence of hydronephrosis. Stomach/Bowel: Stomach appears within normal limits. No dilated bowel loops. Vascular/Lymphatic: Upper abdominal vascularity is patent. Normal caliber abdominal aorta. No adenopathy identified. Other:  No free fluid or fluid collections identified. Musculoskeletal: No suspicious bone lesions identified. IMPRESSION: 1. Choledocholithiasis with mild dilatation of the common bile duct and intrahepatic bile ducts. 2. Mild gallbladder wall thickening is identified. No stones noted within the lumen of the gallbladder. Electronically Signed   By: Kerby Moors M.D.   On: 08/05/2022 07:27   MR 3D Recon At Scanner  Result Date: 08/05/2022 CLINICAL DATA:  Evaluate for gallstones.  Pancreatitis. EXAM: MRI ABDOMEN WITHOUT AND WITH CONTRAST (INCLUDING MRCP) TECHNIQUE: Multiplanar multisequence MR imaging of the abdomen was performed both before and after the administration of intravenous contrast. Heavily T2-weighted images of the biliary and pancreatic ducts were obtained, and three-dimensional MRCP images were rendered by post processing. CONTRAST:  7.60m GADAVIST GADOBUTROL 1 MMOL/ML IV SOLN COMPARISON:  Ultrasound abdomen 08/05/2022.  MRI abdomen 04/29/2018 FINDINGS: Lower chest: No acute findings. Hepatobiliary: No liver mass identified. No gallstones identified. Mild gallbladder wall thickening is identified measuring up to 3.5 mm, image 22/4. The common bile duct measures up to 9 mm, image 10/12.  Mild intrahepatic bile duct dilatation. Filling defects are identified within the common bile duct compatible with choledocholithiasis. The largest is in the distal third measuring 5 mm in length, image 11/12. Pancreas: No mass, inflammatory changes, or other parenchymal abnormality identified. Spleen:  Within normal limits in size and appearance. Adrenals/Urinary Tract: No masses identified. No evidence of hydronephrosis. Stomach/Bowel: Stomach appears within normal limits. No dilated bowel loops. Vascular/Lymphatic: Upper abdominal vascularity is patent. Normal caliber abdominal aorta. No adenopathy identified. Other:  No free fluid or fluid collections identified. Musculoskeletal: No suspicious bone lesions identified. IMPRESSION: 1. Choledocholithiasis with mild dilatation of the common bile duct and intrahepatic bile ducts. 2. Mild gallbladder wall thickening is identified. No stones noted within the lumen of the gallbladder. Electronically Signed   By: TKerby MoorsM.D.   On: 08/05/2022 07:27   [place]ABDOM[place] LIMITED RUQ (LIVER/GB)  Result Date: 08/05/2022 CLINICAL DATA:  Epigastric and right upper quadrant pain with nausea and vomiting. EXAM: ULTRASOUND ABDOMEN LIMITED RIGHT UPPER QUADRANT COMPARISON:  MRI abdomen 04/29/2018 FINDINGS: Gallbladder: Gallbladder is distended measuring 9.5 x 4.2 x 5.1 cm. The free wall is slightly thickened and measures 3.4 mm. There is no pericholecystic fluid or positive sonographic Murphy's sign. A solitary 7.5 mm linear echogenic structure is noted mobile within the lumen and could be a nonshadowing stone or a cholesterol crystal. No other suggestion of stones is seen. Common bile duct: Diameter: Prominent measuring 8.2 mm. Obscured by bowel gas distally. Visualized portion without filling defects. On the MRI the common bile duct was 5.4 mm. Laboratory and clinical correlation recommended. Liver: No focal lesion identified. Within normal limits in parenchymal echogenicity.  Portal vein is patent on color Doppler imaging with normal direction of blood flow towards the liver. Other: None. IMPRESSION:  1. Distended gallbladder with mild wall thickening. No pericholecystic fluid or positive sonographic Murphy's sign. 2. A solitary 7.5 mm linear echogenic structure is noted mobile within the lumen and could be a nonshadowing stone or a cholesterol crystal. 3. Prominent common bile duct measuring 8.2 mm. On the MRI the common bile duct was 5.4 mm. The distal common bile duct is obscured by bowel gas. Visualized portion without filling defects. Electronically Signed   By: Telford Nab M.D.   On: 08/05/2022 04:47       Impression / Plan:   RUQ pain.dyspepsia/choledocholithiasis- agree with present- recommend ERCP for further mgmt/treatment. This has been discussed by Dr Haig Prophet with Dr Allen Norris and we are planning for tomorrow as clinically feasible. The indications, benefits and risks (including but not limited to bleeding, infectin, perforation, pancreatitis, and difficulty with sedation) have been discussed with her and she is agreeable to the procedure. History of crohns- recommended the crohns and colitis foundations' website for her and recommend to follow up with a GI provider of her choice for disease surveillance/mgmt  Thank you very much for this consult. These services were provided by Stephens November, NP-C, in collaboration with Lesly Rubenstein, MD, with whom I have discussed this patient in full.   Stephens November, NP-C

## 2022-08-05 NOTE — Consult Note (Signed)
Pharmacy Antibiotic Note  SAVI VOICE is a 52 y.o. female admitted on 08/05/2022 with Choledocholithiasis  Pharmacy has been consulted for Zosyn dosing. Pt presents with RUQ abdominal pain. MRCP shows choledocholithiasis   WBC 13.2, afebrile, Scr ~1.  Plan: Zosyn 3.375 gm IV Q8H  Height: 5' 4.5" (163.8 cm) Weight: 81.6 kg (180 lb) IBW/kg (Calculated) : 55.85  Temp (24hrs), Avg:97.7 F (36.5 C), Min:97.7 F (36.5 C), Max:97.7 F (36.5 C)  Recent Labs  Lab 08/05/22 0310  WBC 13.2*  CREATININE 1.02*    Estimated Creatinine Clearance: 69.7 mL/min (A) (by C-G formula based on SCr of 1.02 mg/dL (H)).    No Known Allergies  Antimicrobials this admission: 2/12 Zosyn >>    Dose adjustments this admission:   Microbiology results: 2/12 BCx: ordered   Thank you for allowing pharmacy to be a part of this patient's care.  Alison Murray 08/05/2022 8:13 AM

## 2022-08-05 NOTE — ED Notes (Signed)
UA when able

## 2022-08-05 NOTE — ED Triage Notes (Signed)
Pt to ED via POV. Pt states "I think it's my gallbladder". Pt c/o RUQ abd pain that radiates up under her R ribcage. Pt states pain intermittently radiates to back, c/o pain with palpation.

## 2022-08-06 ENCOUNTER — Inpatient Hospital Stay: Payer: BC Managed Care – PPO | Admitting: Certified Registered"

## 2022-08-06 ENCOUNTER — Inpatient Hospital Stay: Payer: BC Managed Care – PPO

## 2022-08-06 ENCOUNTER — Encounter: Payer: Self-pay | Admitting: Internal Medicine

## 2022-08-06 ENCOUNTER — Encounter
Admission: EM | Disposition: A | Discharge status: Home / Self Care | Payer: Self-pay | Source: Home / Self Care | Attending: Emergency Medicine

## 2022-08-06 DIAGNOSIS — I441 Atrioventricular block, second degree: Secondary | ICD-10-CM | POA: Diagnosis not present

## 2022-08-06 DIAGNOSIS — E876 Hypokalemia: Secondary | ICD-10-CM | POA: Insufficient documentation

## 2022-08-06 DIAGNOSIS — Z789 Other specified health status: Secondary | ICD-10-CM | POA: Diagnosis not present

## 2022-08-06 DIAGNOSIS — K8050 Calculus of bile duct without cholangitis or cholecystitis without obstruction: Secondary | ICD-10-CM | POA: Diagnosis not present

## 2022-08-06 HISTORY — PX: ERCP: SHX5425

## 2022-08-06 LAB — CBC
HCT: 30 % — ABNORMAL LOW (ref 36.0–46.0)
Hemoglobin: 9.5 g/dL — ABNORMAL LOW (ref 12.0–15.0)
MCH: 27.1 pg (ref 26.0–34.0)
MCHC: 31.7 g/dL (ref 30.0–36.0)
MCV: 85.5 fL (ref 80.0–100.0)
Platelets: 364 10*3/uL (ref 150–400)
RBC: 3.51 MIL/uL — ABNORMAL LOW (ref 3.87–5.11)
RDW: 15.3 % (ref 11.5–15.5)
WBC: 9.2 10*3/uL (ref 4.0–10.5)
nRBC: 0 % (ref 0.0–0.2)

## 2022-08-06 LAB — COMPREHENSIVE METABOLIC PANEL
ALT: 34 U/L (ref 0–44)
AST: 40 U/L (ref 15–41)
Albumin: 2.9 g/dL — ABNORMAL LOW (ref 3.5–5.0)
Alkaline Phosphatase: 49 U/L (ref 38–126)
Anion gap: 7 (ref 5–15)
BUN: 11 mg/dL (ref 6–20)
CO2: 24 mmol/L (ref 22–32)
Calcium: 8.1 mg/dL — ABNORMAL LOW (ref 8.9–10.3)
Chloride: 104 mmol/L (ref 98–111)
Creatinine, Ser: 1.16 mg/dL — ABNORMAL HIGH (ref 0.44–1.00)
GFR, Estimated: 58 mL/min — ABNORMAL LOW (ref >60)
Glucose, Bld: 110 mg/dL — ABNORMAL HIGH (ref 70–99)
Potassium: 3.2 mmol/L — ABNORMAL LOW (ref 3.5–5.1)
Sodium: 135 mmol/L (ref 135–145)
Total Bilirubin: 1.3 mg/dL — ABNORMAL HIGH (ref 0.3–1.2)
Total Protein: 6.1 g/dL — ABNORMAL LOW (ref 6.5–8.1)

## 2022-08-06 LAB — MAGNESIUM: Magnesium: 2 mg/dL (ref 1.7–2.4)

## 2022-08-06 LAB — LIPASE, BLOOD: Lipase: 46 U/L (ref 11–51)

## 2022-08-06 LAB — GLUCOSE, CAPILLARY: Glucose-Capillary: 95 mg/dL (ref 70–99)

## 2022-08-06 SURGERY — ERCP, WITH INTERVENTION IF INDICATED
Anesthesia: General

## 2022-08-06 MED ORDER — CHLORHEXIDINE GLUCONATE CLOTH 2 % EX PADS: 6.0000 | MEDICATED_PAD | Freq: Every day | CUTANEOUS | Status: DC

## 2022-08-06 MED ORDER — LACTATED RINGERS IV SOLN: INTRAVENOUS | Status: DC | PRN

## 2022-08-06 MED ORDER — LIDOCAINE HCL (CARDIAC) PF 100 MG/5ML IV SOSY
PREFILLED_SYRINGE | INTRAVENOUS | Status: DC | PRN
  Administered 2022-08-06: 100 mg via INTRAVENOUS

## 2022-08-06 MED ORDER — LACTATED RINGERS IV SOLN: Freq: Once | INTRAVENOUS | Status: AC

## 2022-08-06 MED ORDER — POTASSIUM CHLORIDE 10 MEQ/100ML IV SOLN
10.0000 meq | INTRAVENOUS | Status: AC
  Administered 2022-08-06 (×2): 10 meq via INTRAVENOUS
  Filled 2022-08-06 (×2): qty 100

## 2022-08-06 MED ORDER — PROPOFOL 10 MG/ML IV BOLUS
INTRAVENOUS | Status: DC | PRN
  Administered 2022-08-06 (×2): 40 mg via INTRAVENOUS
  Administered 2022-08-06: 150 mg via INTRAVENOUS
  Administered 2022-08-06: 120 mg via INTRAVENOUS

## 2022-08-06 MED ORDER — INDOMETHACIN 50 MG RE SUPP
RECTAL | Status: AC
  Filled 2022-08-06: qty 2

## 2022-08-06 MED ORDER — DICLOFENAC SUPPOSITORY 100 MG
100.0000 mg | Freq: Once | RECTAL | Status: AC
  Administered 2022-08-06: 100 mg via RECTAL

## 2022-08-06 NOTE — Plan of Care (Signed)

## 2022-08-06 NOTE — Anesthesia Postprocedure Evaluation (Signed)
Anesthesia Post Note  Patient: Madison Conway  Procedure(s) Performed: ENDOSCOPIC RETROGRADE CHOLANGIOPANCREATOGRAPHY (ERCP)  Patient location during evaluation: PACU Anesthesia Type: General Level of consciousness: awake and alert, oriented and patient cooperative Pain management: pain level controlled Vital Signs Assessment: post-procedure vital signs reviewed and stable Respiratory status: spontaneous breathing, nonlabored ventilation and respiratory function stable Cardiovascular status: blood pressure returned to baseline and stable Postop Assessment: adequate PO intake Anesthetic complications: no   No notable events documented.   Last Vitals:  Vitals:   08/06/22 1053 08/06/22 1105  BP: 102/79 (!) 129/92  Pulse: 72 65  Resp: 11 15  Temp: (!) 36.2 C   SpO2: 100% 100%    Last Pain:  Vitals:   08/06/22 1150  TempSrc:   PainSc: [place]

## 2022-08-06 NOTE — Op Note (Signed)
[facility] Gastroenterology Patient Name: Madison Conway Procedure Date: 08/06/2022 10:19 AM MRN: [id number] Account #: [id number] Date of Birth: 1970/11/11 Admit Type: Inpatient Age: 52 Room: [facility] ENDO ROOM 4 Gender: Female Note Status: Finalized Instrument Name: Selinda Eon [id number] Procedure:             ERCP Indications:           Common bile duct stone(s) Providers:             Lucilla Lame MD, MD Referring MD:          Jearld Fenton (Referring MD) Medicines:             Propofol per Anesthesia Complications:         No immediate complications. Procedure:             Pre-Anesthesia Assessment:                        - Prior to the procedure, a History and Physical was                         performed, and patient medications and allergies were                         reviewed. The patient's tolerance of previous                         anesthesia was also reviewed. The risks and benefits                         of the procedure and the sedation options and risks                         were discussed with the patient. All questions were                         answered, and informed consent was obtained. Prior                         Anticoagulants: The patient has taken no anticoagulant                         or antiplatelet agents. ASA Grade Assessment: II - A                         patient with mild systemic disease. After reviewing                         the risks and benefits, the patient was deemed in                         satisfactory condition to undergo the procedure.                        After obtaining informed consent, the scope was passed                         under direct vision. Throughout the procedure, the  patient's blood pressure, pulse, and oxygen                         saturations were monitored continuously. The                         Duodenoscope was introduced through the mouth, and                          used to inject contrast into and used to inject                         contrast into the bile duct. The ERCP was accomplished                         without difficulty. The patient tolerated the                         procedure well. Findings:      The scout film was normal. The esophagus was successfully intubated       under direct vision. The scope was advanced to a normal major papilla in       the descending duodenum without detailed examination of the pharynx,       larynx and associated structures, and upper GI tract. The upper GI tract       was grossly normal. The bile duct was deeply cannulated with the       short-nosed traction sphincterotome. Contrast was injected. I personally       interpreted the bile duct images. There was brisk flow of contrast       through the ducts. Image quality was excellent. Contrast extended to the       entire biliary tree. The middle third of the main bile duct contained       one stone. A wire was passed into the biliary tree. A 5 mm biliary       sphincterotomy was made with a monofilament traction (standard)       sphincterotome using ERBE electrocautery. The sphincterotomy oozed       blood. The biliary tree was swept with a 15 mm balloon starting at the       bifurcation. One stone was removed. No stones remained. Impression:            - Choledocholithiasis was found. Complete removal was                         accomplished by biliary sphincterotomy and balloon                         extraction.                        - A biliary sphincterotomy was performed.                        - The biliary tree was swept. Recommendation:        - Return patient to hospital ward for ongoing care.                        - Clear liquid diet.                        -  Watch for pancreatitis, bleeding, perforation, and                         cholangitis. Procedure Code(s):     --- Professional ---                        [phone], Endoscopic  retrograde cholangiopancreatography                         (ERCP); with removal of calculi/debris from                         biliary/pancreatic duct(s)                        43262, Endoscopic retrograde cholangiopancreatography                         (ERCP); with sphincterotomy/papillotomy                        [phone], Endoscopic catheterization of the biliary                         ductal system, radiological supervision and                         interpretation Diagnosis Code(s):     --- Professional ---                        K80.50, Calculus of bile duct without cholangitis or                         cholecystitis without obstruction CPT copyright 2022 American Medical Association. All rights reserved. The codes documented in this report are preliminary and upon coder review may  be revised to meet current compliance requirements. Lucilla Lame MD, MD 08/06/2022 10:53:32 AM This report has been signed electronically. Number of Addenda: 0 Note Initiated On: 08/06/2022 10:19 AM Estimated Blood Loss:  Estimated blood loss: none.      [facility]

## 2022-08-06 NOTE — TOC Progression Note (Signed)
Transition of Care [facility]) - Progression Note    Patient Details  Name: AMYBETH SUTTNER MRN: [id number] Date of Birth: 1970-07-07  Transition of Care [facility]) CM/SW [place], RN Phone Number: 08/06/2022, 9:25 AM  Clinical Narrative:     Transition of Care (TOC) Screening Note   Patient Details  Name: MATTELYN QIAO Date of Birth: 22-Jun-1971   Transition of Care [facility]) CM/SW Contact:    Conception Oms, RN Phone Number: 08/06/2022, 9:25 AM    Transition of Care Department [facility]) has reviewed patient and no TOC needs have been identified at this time. We will continue to monitor patient advancement through interdisciplinary progression rounds. If new patient transition needs arise, please place a TOC consult.     Expected Discharge Plan: Home/Self Care Barriers to Discharge: No Barriers Identified  Expected Discharge Plan and Services                                               Social Determinants of Health (SDOH) Interventions SDOH Screenings   Food Insecurity: No Food Insecurity (08/05/2022)  Housing: Low Risk  (08/05/2022)  Transportation Needs: No Transportation Needs (08/05/2022)  Utilities: Not At Risk (08/05/2022)  Alcohol Screen: Low Risk  (03/05/2022)  Depression (PHQ2-9): Low Risk  (03/05/2022)  Tobacco Use: Low Risk  (08/05/2022)    Readmission Risk Interventions     No data to display

## 2022-08-06 NOTE — Anesthesia Preprocedure Evaluation (Signed)
Anesthesia Evaluation  Patient identified by MRN, date of birth, ID band Patient awake    Reviewed: Allergy & Precautions, NPO status , Patient's Chart, lab work & pertinent test results  History of Anesthesia Complications Negative for: history of anesthetic complications  Airway Mallampati: IV   Neck ROM: Full    Dental no notable dental hx.    Pulmonary neg pulmonary ROS   Pulmonary exam normal breath sounds clear to auscultation       Cardiovascular hypertension, Normal cardiovascular exam Rhythm:Regular Rate:Normal  ECG 08/05/22: SR with 2nd degree AVB   Neuro/Psych negative neurological ROS     GI/Hepatic negative GI ROS,,,  Endo/Other  Obesity   Renal/GU negative Renal ROS     Musculoskeletal   Abdominal   Peds  Hematology  (+) Blood dyscrasia, anemia   Anesthesia Other Findings   Reproductive/Obstetrics                             Anesthesia Physical Anesthesia Plan  ASA: 2  Anesthesia Plan: General   Post-op Pain Management:    Induction: Intravenous  PONV Risk Score and Plan: 3 and Propofol infusion, TIVA and Treatment may vary due to age or medical condition  Airway Management Planned: Natural Airway  Additional Equipment:   Intra-op Plan:   Post-operative Plan:   Informed Consent: I have reviewed the patients History and Physical, chart, labs and discussed the procedure including the risks, benefits and alternatives for the proposed anesthesia with the patient or authorized representative who has indicated his/her understanding and acceptance.       Plan Discussed with: CRNA  Anesthesia Plan Comments: (LMA/GETA backup discussed.  Patient consented for risks of anesthesia including but not limited to:  - adverse reactions to medications - damage to eyes, teeth, lips or other oral mucosa - nerve damage due to positioning  - sore throat or hoarseness -  damage to heart, brain, nerves, lungs, other parts of body or loss of life  Informed patient about role of CRNA in peri- and intra-operative care.  Patient voiced understanding.)       Anesthesia Quick Evaluation

## 2022-08-06 NOTE — Progress Notes (Addendum)
  Progress Note   Patient: Madison Conway JKD:[id number] DOB: [birth year] DOA: 08/05/2022     1 DOS: the patient was seen and examined on 08/06/2022   Brief hospital course: Madison Conway is a 52 y.o. female with medical history significant of hypertension, asthma, GERD, thrombocytosis, anemia, obesity obesity BMI 30.42, alcohol use, who presents with abdominal pain.  MRCP showed CBD stone. Patient had a ERCP performed 2/13.   Principal Problem:   Choledocholithiasis Active Problems:   Hypertension   Asthma   Thrombocytosis   Iron deficiency anemia   AV block, Mobitz II   Alcohol use   Class 1 obesity due to excess calories with body mass index (BMI) of 30.0 to 30.9 in adult   Hypokalemia   Assessment and Plan:  Choledocholithiasis:  Patient is status post ERCP, doing well.  Will monitor patient overnight.  Continue current antibiotics for another day.  Liver function panel and lipase tomorrow.   Also consult general surgery for possible cholecystectomy.   Hypertension Hold off HCTZ  Asthma: Stable -As needed albuterol   Thrombocytosis: Chronic and stable.  Recheck level tomorrow.  Iron deficiency anemia: Hemoglobin 11.1 Continue iron Supplement   AV block, Mobitz II: Patient had 1 episode of Mobitz 2 AV block.  No history of this issue. Seen by cardiology, no need for intervention.   Alcohol use: Patient has no evidence of withdrawal.  Continue to follow.  Likely chronic kidney disease stage IIIa. Hypokalemia. Replete potassium, recheck renal function tomorrow.   Class 1 obesity due to excess calories with body mass index (BMI) of 30.0 to 30.9 in adult:  Diet and exercise.       Subjective:  Patient doing well, no abdominal pain or nausea vomiting.  Physical Exam: Vitals:   08/06/22 0816 08/06/22 1002 08/06/22 1053 08/06/22 1105  BP: 138/81 (!) 150/93 102/79 (!) 129/92  Pulse: 71 69 72 65  Resp: '15 16 11 15  '$ Temp: 98.5 F (36.9 C) (!) 97.3 F  (36.3 C) (!) 97.2 F (36.2 C)   TempSrc:  Temporal Temporal   SpO2: 100% 100% 100% 100%  Weight:  83 kg    Height:  5' 4.5" (1.638 m)     General exam: Appears calm and comfortable, obese Respiratory system: Clear to auscultation. Respiratory effort normal. Cardiovascular system: S1 & S2 heard, RRR. No JVD, murmurs, rubs, gallops or clicks. No pedal edema. Gastrointestinal system: Abdomen is nondistended, soft and nontender. No organomegaly or masses felt. Normal bowel sounds heard. Central nervous system: Alert and oriented. No focal neurological deficits. Extremities: Symmetric 5 x 5 power. Skin: No rashes, lesions or ulcers Psychiatry: Judgement and insight appear normal. Mood & affect appropriate.    Data Reviewed:  MRCP and lab results reviewed.  Family Communication: None  Disposition: Status is: Inpatient Remains inpatient appropriate because: Disease, inpatient procedure.     Time spent: 35 minutes  Author: Sharen Hones, MD 08/06/2022 12:06 PM  For on call review [email or web address].

## 2022-08-06 NOTE — Progress Notes (Signed)
Rounding Note    Patient Name: Madison Conway Date of Encounter: 08/06/2022  [place] Cardiologist: None  New consult done by Dr. Fletcher Anon  Subjective   Patient seen on rounds this afternoon. Underwent successful ERCP this morning without incident. States that she is feeling a lot better currently. Denies any chest pain, shortness of breath, or palpitations. No over night events have been recorded.   Inpatient Medications    Scheduled Meds:  ferrous sulfate  325 mg Oral Once per day on Mon Wed Fri   folic acid  1 mg Oral Daily   indomethacin       irbesartan  150 mg Oral Daily   multivitamin with minerals  1 tablet Oral QODAY   mupirocin ointment  1 Application Nasal BID   thiamine  100 mg Oral Daily   Continuous Infusions:  piperacillin-tazobactam (ZOSYN)  IV 3.375 g (08/06/22 1359)   PRN Meds: acetaminophen, albuterol, dextromethorphan-guaiFENesin, famotidine, hydrALAZINE, HYDROmorphone (DILAUDID) injection, indomethacin, ondansetron (ZOFRAN) IV, oxyCODONE-acetaminophen   Vital Signs    Vitals:   08/06/22 0816 08/06/22 1002 08/06/22 1053 08/06/22 1105  BP: 138/81 (!) 150/93 102/79 (!) 129/92  Pulse: 71 69 72 65  Resp: 15 16 11 15  $ Temp: 98.5 F (36.9 C) (!) 97.3 F (36.3 C) (!) 97.2 F (36.2 C)   TempSrc:  Temporal Temporal   SpO2: 100% 100% 100% 100%  Weight:  83 kg    Height:  5' 4.5" (1.638 m)      Intake/Output Summary (Last 24 hours) at 08/06/2022 1428 Last data filed at 08/06/2022 1045 Gross per 24 hour  Intake 421.24 ml  Output 600 ml  Net -178.76 ml      08/06/2022   10:02 AM 08/05/2022    3:07 AM 03/05/2022    8:06 AM  Last 3 Weights  Weight (lbs) 183 lb 180 lb 181 lb  Weight (kg) 83.008 kg 81.647 kg 82.101 kg      Telemetry    Sinus brady to sinus rates of 50-70, I 3 beat run of NSVT - Personally Reviewed  ECG    No new tracings - Personally Reviewed  Physical Exam   GEN: No acute distress.   Neck: No JVD Cardiac:  RRR, no murmurs, rubs, or gallops.  Respiratory: Clear to auscultation bilaterally. Respirations are unlabored at rest on room air GI: Soft, nontender, non-distended  MS: No edema; No deformity. Neuro:  Nonfocal  Psych: Normal affect   Labs    High Sensitivity Troponin:   Recent Labs  Lab 08/05/22 0310 08/05/22 0721  TROPONINIHS 3 3     Chemistry Recent Labs  Lab 08/05/22 0310 08/06/22 0328 08/06/22 0330  NA 137  --  135  K 3.7  --  3.2*  CL 103  --  104  CO2 25  --  24  GLUCOSE 140*  --  110*  BUN 12  --  11  CREATININE 1.02*  --  1.16*  CALCIUM 8.7*  --  8.1*  MG 1.9 2.0  --   PROT 7.4  --  6.1*  ALBUMIN 3.6  --  2.9*  AST 67*  --  40  ALT 43  --  34  ALKPHOS 56  --  49  BILITOT 0.8  --  1.3*  GFRNONAA >60  --  58*  ANIONGAP 9  --  7    Lipids No results for input(s): "CHOL", "TRIG", "HDL", "LABVLDL", "LDLCALC", "CHOLHDL" in the last 168 hours.  Hematology Recent Labs  Lab 08/05/22 0310 08/06/22 0330  WBC 13.2* 9.2  RBC 4.06 3.51*  HGB 11.1* 9.5*  HCT 34.3* 30.0*  MCV 84.5 85.5  MCH 27.3 27.1  MCHC 32.4 31.7  RDW 14.8 15.3  PLT 429* 364   Thyroid No results for input(s): "TSH", "FREET4" in the last 168 hours.  BNPNo results for input(s): "BNP", "PROBNP" in the last 168 hours.  DDimer No results for input(s): "DDIMER" in the last 168 hours.   Radiology    DG C-Arm 1-60 Min-No Report  Result Date: 08/06/2022 Fluoroscopy was utilized by the requesting physician.  No radiographic interpretation.   MR ABDOMEN MRCP W WO CONTAST  Result Date: 08/05/2022 CLINICAL DATA:  Evaluate for gallstones.  Pancreatitis. EXAM: MRI ABDOMEN WITHOUT AND WITH CONTRAST (INCLUDING MRCP) TECHNIQUE: Multiplanar multisequence MR imaging of the abdomen was performed both before and after the administration of intravenous contrast. Heavily T2-weighted images of the biliary and pancreatic ducts were obtained, and three-dimensional MRCP images were rendered by post processing.  CONTRAST:  7.58m GADAVIST GADOBUTROL 1 MMOL/ML IV SOLN COMPARISON:  Ultrasound abdomen 08/05/2022.  MRI abdomen 04/29/2018 FINDINGS: Lower chest: No acute findings. Hepatobiliary: No liver mass identified. No gallstones identified. Mild gallbladder wall thickening is identified measuring up to 3.5 mm, image 22/4. The common bile duct measures up to 9 mm, image 10/12. Mild intrahepatic bile duct dilatation. Filling defects are identified within the common bile duct compatible with choledocholithiasis. The largest is in the distal third measuring 5 mm in length, image 11/12. Pancreas: No mass, inflammatory changes, or other parenchymal abnormality identified. Spleen:  Within normal limits in size and appearance. Adrenals/Urinary Tract: No masses identified. No evidence of hydronephrosis. Stomach/Bowel: Stomach appears within normal limits. No dilated bowel loops. Vascular/Lymphatic: Upper abdominal vascularity is patent. Normal caliber abdominal aorta. No adenopathy identified. Other:  No free fluid or fluid collections identified. Musculoskeletal: No suspicious bone lesions identified. IMPRESSION: 1. Choledocholithiasis with mild dilatation of the common bile duct and intrahepatic bile ducts. 2. Mild gallbladder wall thickening is identified. No stones noted within the lumen of the gallbladder. Electronically Signed   By: TKerby MoorsM.D.   On: 08/05/2022 07:27   MR 3D Recon At Scanner  Result Date: 08/05/2022 CLINICAL DATA:  Evaluate for gallstones.  Pancreatitis. EXAM: MRI ABDOMEN WITHOUT AND WITH CONTRAST (INCLUDING MRCP) TECHNIQUE: Multiplanar multisequence MR imaging of the abdomen was performed both before and after the administration of intravenous contrast. Heavily T2-weighted images of the biliary and pancreatic ducts were obtained, and three-dimensional MRCP images were rendered by post processing. CONTRAST:  7.586mGADAVIST GADOBUTROL 1 MMOL/ML IV SOLN COMPARISON:  Ultrasound abdomen 08/05/2022.  MRI  abdomen 04/29/2018 FINDINGS: Lower chest: No acute findings. Hepatobiliary: No liver mass identified. No gallstones identified. Mild gallbladder wall thickening is identified measuring up to 3.5 mm, image 22/4. The common bile duct measures up to 9 mm, image 10/12. Mild intrahepatic bile duct dilatation. Filling defects are identified within the common bile duct compatible with choledocholithiasis. The largest is in the distal third measuring 5 mm in length, image 11/12. Pancreas: No mass, inflammatory changes, or other parenchymal abnormality identified. Spleen:  Within normal limits in size and appearance. Adrenals/Urinary Tract: No masses identified. No evidence of hydronephrosis. Stomach/Bowel: Stomach appears within normal limits. No dilated bowel loops. Vascular/Lymphatic: Upper abdominal vascularity is patent. Normal caliber abdominal aorta. No adenopathy identified. Other:  No free fluid or fluid collections identified. Musculoskeletal: No suspicious bone lesions identified. IMPRESSION: 1. Choledocholithiasis  with mild dilatation of the common bile duct and intrahepatic bile ducts. 2. Mild gallbladder wall thickening is identified. No stones noted within the lumen of the gallbladder. Electronically Signed   By: Kerby Moors M.D.   On: 08/05/2022 07:27   US ABDOMEN LIMITED RUQ (LIVER/GB)  Result Date: 08/05/2022 CLINICAL DATA:  Epigastric and right upper quadrant pain with nausea and vomiting. EXAM: ULTRASOUND ABDOMEN LIMITED RIGHT UPPER QUADRANT COMPARISON:  MRI abdomen 04/29/2018 FINDINGS: Gallbladder: Gallbladder is distended measuring 9.5 x 4.2 x 5.1 cm. The free wall is slightly thickened and measures 3.4 mm. There is no pericholecystic fluid or positive sonographic Murphy's sign. A solitary 7.5 mm linear echogenic structure is noted mobile within the lumen and could be a nonshadowing stone or a cholesterol crystal. No other suggestion of stones is seen. Common bile duct: Diameter: Prominent  measuring 8.2 mm. Obscured by bowel gas distally. Visualized portion without filling defects. On the MRI the common bile duct was 5.4 mm. Laboratory and clinical correlation recommended. Liver: No focal lesion identified. Within normal limits in parenchymal echogenicity. Portal vein is patent on color Doppler imaging with normal direction of blood flow towards the liver. Other: None. IMPRESSION: 1. Distended gallbladder with mild wall thickening. No pericholecystic fluid or positive sonographic Murphy's sign. 2. A solitary 7.5 mm linear echogenic structure is noted mobile within the lumen and could be a nonshadowing stone or a cholesterol crystal. 3. Prominent common bile duct measuring 8.2 mm. On the MRI the common bile duct was 5.4 mm. The distal common bile duct is obscured by bowel gas. Visualized portion without filling defects. Electronically Signed   By: Telford Nab M.D.   On: 08/05/2022 04:47    Cardiac Studies     Patient Profile     52 y.o. female with a past medical history of hypertension, asthma, gastroesophageal reflux disease, anemia, obesity, and history of gallstones who has been seen and evaluated for an abnormal EKG with transient type II AV block during a violent vomiting episode.  Assessment & Plan    Transient second-degree AV block -Patient continues to remain asymptomatic -No further episodes have been noted on remote telemetry overnight -Was likely secondary to vasovagal episode due to violent vomiting -Continue on telemetry monitoring -No further cardiac testing needed at this time -Need episodes during hospitalization recommend ZIO XT on discharge  Hypertension -Blood pressure 129/92 -Continued on Avapro 150 mg daily -HCTZ remains on hold due to elevated kidney function -As needed hydralazine ordered -Vital signs per unit protocol  Hypokalemia -Serum potassium 3.2 -IV supplements of potassium currently infusing -Daily BMP -Monitor/trend/replete  electrolytes as needed  Status post ERCP -Management per GI     For questions or updates, please contact [place] Please consult [email or web address] for contact info under        Signed, Humna Moorehouse, NP  08/06/2022, 2:28 PM

## 2022-08-06 NOTE — Transfer of Care (Signed)
Immediate Anesthesia Transfer of Care Note  Patient: Madison Conway  Procedure(s) Performed: ENDOSCOPIC RETROGRADE CHOLANGIOPANCREATOGRAPHY (ERCP)  Patient Location: Endoscopy Unit  Anesthesia Type:General  Level of Consciousness: awake, alert , and oriented  Airway & Oxygen Therapy: Patient Spontanous Breathing and Patient connected to nasal cannula oxygen  Post-op Assessment: Report given to RN, Post -op Vital signs reviewed and stable, and Patient moving all extremities  Post vital signs: Reviewed and stable  Last Vitals:  Vitals Value Taken Time  BP 102/79 08/06/22 1055  Temp 36.2 C 08/06/22 1053  Pulse 69 08/06/22 1057  Resp 17 08/06/22 1057  SpO2 100 % 08/06/22 1057  Vitals shown include unvalidated device data.  Last Pain:  Vitals:   08/06/22 1053  TempSrc: Temporal  PainSc: 0-No pain         Complications: No notable events documented.

## 2022-08-07 ENCOUNTER — Ambulatory Visit: Payer: BC Managed Care – PPO | Attending: Cardiology

## 2022-08-07 ENCOUNTER — Encounter: Payer: Self-pay | Admitting: Gastroenterology

## 2022-08-07 ENCOUNTER — Telehealth: Payer: Self-pay | Admitting: *Deleted

## 2022-08-07 DIAGNOSIS — D508 Other iron deficiency anemias: Secondary | ICD-10-CM

## 2022-08-07 DIAGNOSIS — I441 Atrioventricular block, second degree: Secondary | ICD-10-CM

## 2022-08-07 DIAGNOSIS — N182 Chronic kidney disease, stage 2 (mild): Secondary | ICD-10-CM | POA: Insufficient documentation

## 2022-08-07 DIAGNOSIS — D75839 Thrombocytosis, unspecified: Secondary | ICD-10-CM

## 2022-08-07 DIAGNOSIS — K8050 Calculus of bile duct without cholangitis or cholecystitis without obstruction: Secondary | ICD-10-CM | POA: Diagnosis not present

## 2022-08-07 DIAGNOSIS — E6609 Other obesity due to excess calories: Secondary | ICD-10-CM

## 2022-08-07 DIAGNOSIS — E876 Hypokalemia: Secondary | ICD-10-CM

## 2022-08-07 DIAGNOSIS — I10 Essential (primary) hypertension: Secondary | ICD-10-CM | POA: Diagnosis not present

## 2022-08-07 DIAGNOSIS — N183 Chronic kidney disease, stage 3 unspecified: Secondary | ICD-10-CM

## 2022-08-07 DIAGNOSIS — Z6830 Body mass index (BMI) 30.0-30.9, adult: Secondary | ICD-10-CM

## 2022-08-07 LAB — COMPREHENSIVE METABOLIC PANEL
ALT: 46 U/L — ABNORMAL HIGH (ref 0–44)
AST: 34 U/L (ref 15–41)
Albumin: 3.1 g/dL — ABNORMAL LOW (ref 3.5–5.0)
Alkaline Phosphatase: 62 U/L (ref 38–126)
Anion gap: 7 (ref 5–15)
BUN: 7 mg/dL (ref 6–20)
CO2: 25 mmol/L (ref 22–32)
Calcium: 8.3 mg/dL — ABNORMAL LOW (ref 8.9–10.3)
Chloride: 105 mmol/L (ref 98–111)
Creatinine, Ser: 1.14 mg/dL — ABNORMAL HIGH (ref 0.44–1.00)
GFR, Estimated: 59 mL/min — ABNORMAL LOW (ref >60)
Glucose, Bld: 97 mg/dL (ref 70–99)
Potassium: 3.5 mmol/L (ref 3.5–5.1)
Sodium: 137 mmol/L (ref 135–145)
Total Bilirubin: 1.6 mg/dL — ABNORMAL HIGH (ref 0.3–1.2)
Total Protein: 6.3 g/dL — ABNORMAL LOW (ref 6.5–8.1)

## 2022-08-07 LAB — CBC
HCT: 31.1 % — ABNORMAL LOW (ref 36.0–46.0)
Hemoglobin: 10.1 g/dL — ABNORMAL LOW (ref 12.0–15.0)
MCH: 27.7 pg (ref 26.0–34.0)
MCHC: 32.5 g/dL (ref 30.0–36.0)
MCV: 85.2 fL (ref 80.0–100.0)
Platelets: 399 10*3/uL (ref 150–400)
RBC: 3.65 MIL/uL — ABNORMAL LOW (ref 3.87–5.11)
RDW: 15.2 % (ref 11.5–15.5)
WBC: 9.7 10*3/uL (ref 4.0–10.5)
nRBC: 0 % (ref 0.0–0.2)

## 2022-08-07 LAB — LIPASE, BLOOD: Lipase: 38 U/L (ref 11–51)

## 2022-08-07 LAB — GLUCOSE, CAPILLARY: Glucose-Capillary: 91 mg/dL (ref 70–99)

## 2022-08-07 LAB — MAGNESIUM: Magnesium: 2.3 mg/dL (ref 1.7–2.4)

## 2022-08-07 SURGERY — CHOLECYSTECTOMY, ROBOT-ASSISTED, LAPAROSCOPIC
Anesthesia: General

## 2022-08-07 MED ORDER — AMOXICILLIN-POT CLAVULANATE 875-125 MG PO TABS: 1.0000 | ORAL_TABLET | Freq: Two times a day (BID) | ORAL | 0 refills | Status: AC

## 2022-08-07 MED ORDER — MUPIROCIN 2 % EX OINT: 1.0000 | TOPICAL_OINTMENT | Freq: Two times a day (BID) | CUTANEOUS | 0 refills | Status: DC

## 2022-08-07 NOTE — Assessment & Plan Note (Signed)
Can go back on olmesartan hydrochlorothiazide as outpatient

## 2022-08-07 NOTE — Progress Notes (Signed)
Patient said she is refusing to have cholecystectomy.  Patient was aware that she was NPO after midnight.

## 2022-08-07 NOTE — Progress Notes (Signed)
1012 D/C AVS completed and reviewed with pt. All opportunities for questions answered and clarified. IV will be removed. Pt will be wheeled down to car at medical mall entrance via wheelchair. Barriers for d/c pt needs to tolerate reg food prior to be d/c home. Ride will be here at 12pm

## 2022-08-07 NOTE — Plan of Care (Signed)
  Problem: Activity: Goal: Risk for activity intolerance will decrease Outcome: Progressing   Problem: Pain Managment: Goal: General experience of comfort will improve Outcome: Progressing   

## 2022-08-07 NOTE — Telephone Encounter (Signed)
-----   Message from Gerrie Nordmann, NP sent at 08/07/2022  1:50 PM EST ----- Regarding: hospital follow up and Zio XT Please mail patient a Zio XT monitor to wear for 2 weeks for type 2 AVB while in the ED and she will need a hospital follow up in 6 weeks Thank you Barbera Setters

## 2022-08-07 NOTE — Hospital Course (Signed)
52 y.o. female with medical history significant of hypertension, asthma, GERD, thrombocytosis, anemia, obesity obesity BMI 30.42, alcohol use, who presents with abdominal pain.    Patient states that her abdominal pain started last night, which is located on right upper quadrant, constant, initially 10 out of 10 in severity, currently 3 out of 10 in severity, radiating to the back, associated with multiple episodes of nonbilious nonbloody vomiting.  Patient has few times of loose stool bowel movement, but no active diarrhea.    Data reviewed independently and ED Course: pt was found to have WBC 13.2, GFR> 60, normal liver function, lipase 52, temperature normal, blood pressure 130/76, heart rate 89, RR 20, oxygen saturation 100% on room air.  Patient is admitted to [facility] bed as inpatient.  Dr. Haig Prophet for GI is consulted by EDP, who will discuss with Dr. Allen Norris for possible ERCP.  Consulted Dr. Fletcher Anon of cardiology.   US-RUQ: 1. Distended gallbladder with mild wall thickening. No pericholecystic fluid or positive sonographic Murphy's sign. 2. A solitary 7.5 mm linear echogenic structure is noted mobile within the lumen and could be a nonshadowing stone or a cholesterol crystal. 3. Prominent common bile duct measuring 8.2 mm. On the MRI the common bile duct was 5.4 mm. The distal common bile duct is obscured by bowel gas. Visualized portion without filling defects     MRCP: 1. Choledocholithiasis with mild dilatation of the common bile duct and intrahepatic bile ducts. 2. Mild gallbladder wall thickening is identified. No stones noted within the lumen of the gallbladderthat she drinks 1- 2 glasses of mixed alcohol, 3-4 times per week.   2/13.  Dr. Allen Norris did an ERCP, choledocholithiasis was found, complete removal accomplished by biliary sphincterotomy and balloon extraction. 2/14.  Patient refused cholecystectomy which is standard of care after choledocholithiasis.  High risk for this to recur  again.  Patient discharged home after tolerating solid food.

## 2022-08-07 NOTE — Consult Note (Signed)
[facility] SURGICAL CONSULTATION NOTE (initial) - [id number]   HISTORY OF PRESENT ILLNESS (HPI):  52 y.o. female presented to [facility] ED on 02/12 for evaluation of abdominal pain. Patient presented with acute RUQ abdominal pain with associated nausea and emesis. She has a history of similar in the past but this was significantly more severe than previous episodes. She has a history of cholelithiasis in the past. No fever, chills, cough, SOB, CP, urinary changes, or bowel changes. No previous intra-abdominal surgeries. Work up in the ED was concerning for choledocholithiasis. She was admitted to the medicine service and underwent ERCP on 02/13.    Surgery is consulted by hospitalist physician Dr. Sharen Hones, MD in this context for evaluation and management of choledocholithiasis.  This morning, she reports that she is feeling better. No abdominal pain, fever, chills, nausea, emesis. Labs this morning are reassuring aside from slightly elevated bilirubin to 1.6 (from 1.3). She is NOT interested in cholecystectomy and wants to go home.    PAST MEDICAL HISTORY (PMH):  Past Medical History:  Diagnosis Date   Asthma    childhood.   HTN (hypertension)    Thrombocytosis      PAST SURGICAL HISTORY ([place]):  Past Surgical History:  Procedure Laterality Date   COLONOSCOPY WITH PROPOFOL N/A 05/10/2021   Procedure: COLONOSCOPY WITH PROPOFOL;  Surgeon: Virgel Manifold, MD;  Location: [facility] ENDOSCOPY;  Service: Endoscopy;  Laterality: N/A;   NO PAST SURGERIES       MEDICATIONS:  Prior to Admission medications   Medication Sig Start Date End Date Taking? Authorizing Provider  ALAYCEN 1/35 tablet TAKE 1 TABLET BY MOUTH EVERY DAY 03/25/22  Yes Baity, Coralie Keens, NP  famotidine (PEPCID) 10 MG tablet Take 10 mg by mouth daily as needed for heartburn or indigestion.   Yes [provider]  ferrous sulfate (FER-IN-SOL) 75 (15 Fe) MG/ML SOLN Take 15 mg of iron by mouth 3 (three)  times a week.    Yes [provider]  Multiple Vitamins-Minerals (MULTIVITAMIN ADULT PO) Take 1 tablet by mouth every other day.    Yes [provider]  olmesartan-hydrochlorothiazide (BENICAR HCT) 20-12.5 MG tablet TAKE 1 TABLET BY MOUTH EVERY DAY 03/25/22  Yes 55, Coralie Keens, NP     ALLERGIES:  No Known Allergies   SOCIAL HISTORY:  Social History   Socioeconomic History   Marital status: Single    Spouse name: Not on file   Number of children: Not on file   Years of education: Not on file   Highest education level: Not on file  Occupational History   Not on file  Tobacco Use   Smoking status: Never   Smokeless tobacco: Never  Vaping Use   Vaping Use: Never used  Substance and Sexual Activity   Alcohol use: Yes    Alcohol/week: 2.0 - 3.0 standard drinks of alcohol    Types: 2 - 3 Standard drinks or equivalent per week   Drug use: No   Sexual activity: Yes    Birth control/protection: Pill  Other Topics Concern   Not on file  Social History Narrative   Not on file   Social Determinants of Health   Financial Resource Strain: Not on file  Food Insecurity: No Food Insecurity (08/05/2022)   Hunger Vital Sign    Worried About Running Out of Food in the Last Year: Never true    Ran Out of Food in the Last Year: Never true  Transportation Needs: No  Transportation Needs (08/05/2022)   PRAPARE - Hydrologist (Medical): No    Lack of Transportation (Non-Medical): No  Physical Activity: Not on file  Stress: Not on file  Social Connections: Not on file  Intimate Partner Violence: Not At Risk (08/05/2022)   Humiliation, Afraid, Rape, and Kick questionnaire    Fear of Current or Ex-Partner: No    Emotionally Abused: No    Physically Abused: No    Sexually Abused: No     FAMILY HISTORY:  Family History  Problem Relation Age of Onset   Diabetes Maternal Grandmother    Breast cancer Neg Hx    Colon cancer Neg Hx    Ovarian  cancer Neg Hx       REVIEW OF SYSTEMS:  Review of Systems  Constitutional:  Negative for chills and fever.  HENT:  Negative for congestion and sore throat.   Cardiovascular:  Negative for chest pain and palpitations.  Gastrointestinal:  Positive for abdominal pain (Resolved), nausea (Resolved) and vomiting (Resolved). Negative for constipation and diarrhea.  Genitourinary:  Negative for dysuria and urgency.  All other systems reviewed and are negative.   VITAL SIGNS:  Temp:  [97.2 F (36.2 C)-98.7 F (37.1 C)] 98.7 F (37.1 C) (02/14 0130) Pulse Rate:  [62-72] 62 (02/14 0130) Resp:  [11-18] 18 (02/14 0130) BP: (102-150)/(79-93) 126/80 (02/14 0130) SpO2:  [99 %-100 %] 99 % (02/14 0130) Weight:  [83 kg] 83 kg (02/13 1002)     Height: 5' 4.5" (163.8 cm) Weight: 83 kg BMI (Calculated): 30.94   INTAKE/OUTPUT:  02/13 0701 - 02/14 0700 In: 305.5 [I.V.:300; IV Piggyback:5.5] Out: 0   PHYSICAL EXAM:  Physical Exam Vitals and nursing note reviewed. Exam conducted with a chaperone present.  Constitutional:      General: She is not in acute distress.    Appearance: She is well-developed and normal weight. She is not ill-appearing.  HENT:     Head: Normocephalic and atraumatic.  Eyes:     General: No scleral icterus.    Extraocular Movements: Extraocular movements intact.  Cardiovascular:     Rate and Rhythm: Normal rate.     Heart sounds: Normal heart sounds. No murmur heard. Pulmonary:     Effort: Pulmonary effort is normal. No respiratory distress.  Abdominal:     General: Abdomen is flat. There is no distension.     Palpations: Abdomen is soft.     Tenderness: There is no abdominal tenderness. There is no guarding or rebound. Negative signs include Murphy's sign.  Genitourinary:    Comments: Deferred Skin:    General: Skin is warm and dry.     Coloration: Skin is not jaundiced or pale.  Neurological:     General: No focal deficit present.     Mental Status: She is  alert and oriented to person, place, and time.  Psychiatric:        Mood and Affect: Mood normal.        Behavior: Behavior normal.      Labs:     Latest Ref Rng & Units 08/07/2022    3:22 AM 08/06/2022    3:30 AM 08/05/2022    3:10 AM  CBC  WBC 4.0 - 10.5 K/uL 9.7  9.2  13.2   Hemoglobin 12.0 - 15.0 g/dL 10.1  9.5  11.1   Hematocrit 36.0 - 46.0 % 31.1  30.0  34.3   Platelets 150 - 400 K/uL 399  364  429       Latest Ref Rng & Units 08/07/2022    3:22 AM 08/06/2022    3:30 AM 08/05/2022    3:10 AM  CMP  Glucose 70 - 99 mg/dL 97  110  140   BUN 6 - 20 mg/dL 7  11  12   $ Creatinine 0.44 - 1.00 mg/dL 1.14  1.16  1.02   Sodium 135 - 145 mmol/L 137  135  137   Potassium 3.5 - 5.1 mmol/L 3.5  3.2  3.7   Chloride 98 - 111 mmol/L 105  104  103   CO2 22 - 32 mmol/L 25  24  25   $ Calcium 8.9 - 10.3 mg/dL 8.3  8.1  8.7   Total Protein 6.5 - 8.1 g/dL 6.3  6.1  7.4   Total Bilirubin 0.3 - 1.2 mg/dL 1.6  1.3  0.8   Alkaline Phos 38 - 126 U/L 62  49  56   AST 15 - 41 U/L 34  40  67   ALT 0 - 44 U/L 46  34  43     Imaging studies:   RUQ [place] (09/03/2022) personally reviewed showing prominent CBD, gallbladder distension, no evidence of cholecystitis, and radiologist report reviewed below: IMPRESSION: 1. Distended gallbladder with mild wall thickening. No pericholecystic fluid or positive sonographic Murphy's sign. 2. A solitary 7.5 mm linear echogenic structure is noted mobile within the lumen and could be a nonshadowing stone or a cholesterol crystal. 3. Prominent common bile duct measuring 8.2 mm. On the MRI the common bile duct was 5.4 mm. The distal common bile duct is obscured by bowel gas. Visualized portion without filling defects.   MRCP (08/05/2022) personally reviewed and positive for choledocholithiasis, and radiologist report reviewed below:  IMPRESSION: 1. Choledocholithiasis with mild dilatation of the common bile duct and intrahepatic bile ducts. 2. Mild gallbladder  wall thickening is identified. No stones noted within the lumen of the gallbladder.   Assessment/Plan: (ICD-10's: K80.50) 52 y.o. female with choledocholithiasis s/p ERCP on 02/13.   - Had long discussion with patient this morning regarding her disease process and recommendation for cholecystectomy to prevent recurrence. Patient refuses surgery. She understands there remains a risk of recurrence and potentially complication including cholangitis, cholecystitis, need for repeat or further procedures, sepsis. She continues to refuse cholecystectomy and vocalizes understanding of the risk.    - Okay to advance diet as tolerated - Moniotr abdominal examination - Pain control prn; antiemetics prn   - Mobilize   - Further management per primary service   - Discharge Planning; Patient refusing surgery, okay to advance diet. If tolerates, she can be discharged home. I will update DC instructions with information. I will also leave our contact information incase things change in the future.   All of the above findings and recommendations were discussed with the patient, and all of patient's questions were answered to her expressed satisfaction.  Thank you for the opportunity to participate in this patient's care.   -- Edison Simon, PA-C [facility] 08/07/2022, 8:10 AM M-F: 7am - 4pm

## 2022-08-07 NOTE — Assessment & Plan Note (Signed)
Seen by cardiology.  Can follow-up as outpatient.

## 2022-08-07 NOTE — Assessment & Plan Note (Signed)
2/13 had ERCP with biliary sphincterotomy and balloon extraction by Dr. Allen Norris.  Patient feeling much better.  Refused cholecystectomy.  Discharged home on a few more days of Augmentin.  General surgery team gave the contact information for follow-up appointment if the patient so chooses.  High risk for this to recur.

## 2022-08-07 NOTE — Plan of Care (Signed)

## 2022-08-07 NOTE — Assessment & Plan Note (Signed)
BMI 30.93 with current height and weight in computer

## 2022-08-07 NOTE — Assessment & Plan Note (Signed)
Likely acute phase reactant with choledocholithiasis.  This has improved.

## 2022-08-07 NOTE — Telephone Encounter (Signed)
Monitor order placed. Will forward to scheduling to schedule.

## 2022-08-07 NOTE — Assessment & Plan Note (Signed)
Replaced during hospital course 

## 2022-08-07 NOTE — Assessment & Plan Note (Signed)
Last hemoglobin 10.1

## 2022-08-07 NOTE — Discharge Summary (Signed)
Physician Discharge Summary   Patient: Madison Conway MRN: [id number] DOB: 1971-04-07  Admit date:     08/05/2022  Discharge date: 08/07/22  Discharge Physician: Loletha Grayer   PCP: Jearld Fenton, NP   Recommendations at discharge:   Follow-up PCP 5 days Follow-up general surgery  Discharge Diagnoses: Principal Problem:   Choledocholithiasis Active Problems:   Hypertension   Asthma   Thrombocytosis   Iron deficiency anemia   Mobitz type 1 second degree AV block   Alcohol use   Class 1 obesity due to excess calories with body mass index (BMI) of 30.0 to 30.9 in adult   Hypokalemia   CKD (chronic kidney disease), symptom management only, stage 3 (moderate) [facility])    Hospital Course: 52 y.o. female with medical history significant of hypertension, asthma, GERD, thrombocytosis, anemia, obesity obesity BMI 30.42, alcohol use, who presents with abdominal pain.    Patient states that her abdominal pain started last night, which is located on right upper quadrant, constant, initially 10 out of 10 in severity, currently 3 out of 10 in severity, radiating to the back, associated with multiple episodes of nonbilious nonbloody vomiting.  Patient has few times of loose stool bowel movement, but no active diarrhea.    Data reviewed independently and ED Course: pt was found to have WBC 13.2, GFR> 60, normal liver function, lipase 52, temperature normal, blood pressure 130/76, heart rate 89, RR 20, oxygen saturation 100% on room air.  Patient is admitted to [facility] as inpatient.  Dr. Haig Prophet for GI is consulted by EDP, who will discuss with Dr. Allen Norris for possible ERCP.  Consulted Dr. Fletcher Anon of cardiology.   US-RUQ: 1. Distended gallbladder with mild wall thickening. No pericholecystic fluid or positive sonographic Murphy's sign. 2. A solitary 7.5 mm linear echogenic structure is noted mobile within the lumen and could be a nonshadowing stone or a cholesterol crystal. 3. Prominent  common bile duct measuring 8.2 mm. On the MRI the common bile duct was 5.4 mm. The distal common bile duct is obscured by bowel gas. Visualized portion without filling defects     MRCP: 1. Choledocholithiasis with mild dilatation of the common bile duct and intrahepatic bile ducts. 2. Mild gallbladder wall thickening is identified. No stones noted within the lumen of the gallbladderthat she drinks 1- 2 glasses of mixed alcohol, 3-4 times per week.   2/13.  Dr. Allen Norris did an ERCP, choledocholithiasis was found, complete removal accomplished by biliary sphincterotomy and balloon extraction. 2/14.  Patient refused cholecystectomy which is standard of care after choledocholithiasis.  High risk for this to recur again.  Patient discharged home after tolerating solid food.  Assessment and Plan: * Choledocholithiasis 2/13 had ERCP with biliary sphincterotomy and balloon extraction by Dr. Allen Norris.  Patient feeling much better.  Refused cholecystectomy.  Discharged home on a few more days of Augmentin.  General surgery team gave the contact information for follow-up appointment if the patient so chooses.  High risk for this to recur.  Hypertension Can go back on olmesartan hydrochlorothiazide as outpatient  Thrombocytosis Likely acute phase reactant with choledocholithiasis.  This has improved.  Iron deficiency anemia Last hemoglobin 10.1  Mobitz type 1 second degree AV block Seen by cardiology.  Can follow-up as outpatient.  Class 1 obesity due to excess calories with body mass index (BMI) of 30.0 to 30.9 in adult BMI 30.93 with current height and weight in computer  Hypokalemia Replaced during hospital course  Consultants: Gastroenterology, general surgery Procedures performed: ERCP Disposition: Home Diet recommendation:  Low-fat diet DISCHARGE MEDICATION: Allergies as of 08/07/2022   No Known Allergies      Medication List     TAKE these medications    ALAYCEN  1/35 tablet Generic drug: norethindrone-ethinyl estradiol 1/35 TAKE 1 TABLET BY MOUTH EVERY DAY   amoxicillin-clavulanate 875-125 MG tablet Commonly known as: AUGMENTIN Take 1 tablet by mouth 2 (two) times daily for 5 days.   famotidine 10 MG tablet Commonly known as: PEPCID Take 10 mg by mouth daily as needed for heartburn or indigestion.   ferrous sulfate 75 (15 Fe) MG/ML Soln Commonly known as: FER-IN-SOL Take 15 mg of iron by mouth 3 (three) times a week.   MULTIVITAMIN ADULT PO Take 1 tablet by mouth every other day.   olmesartan-hydrochlorothiazide 20-12.5 MG tablet Commonly known as: BENICAR HCT TAKE 1 TABLET BY MOUTH EVERY DAY        Follow-up Information     [facility] at [facility] Follow up.   Specialty: General Surgery Why: As needed Contact information: [place] [address] [place] [phone]        Jearld Fenton, NP Follow up in 5 day(s).   Specialties: Internal Medicine, Emergency Medicine Contact information: [place] [address] [phone]                Discharge Exam: Filed Weights   08/05/22 0307 08/06/22 1002  Weight: 81.6 kg 83 kg   Physical Exam HENT:     Head: Normocephalic.     Mouth/Throat:     Pharynx: No oropharyngeal exudate.  Eyes:     General: Lids are normal.     Conjunctiva/sclera: Conjunctivae normal.  Cardiovascular:     Rate and Rhythm: Normal rate and regular rhythm.     Heart sounds: Normal heart sounds, S1 normal and S2 normal.  Pulmonary:     Breath sounds: Normal breath sounds. No decreased breath sounds, wheezing, rhonchi or rales.  Abdominal:     Palpations: Abdomen is soft.     Tenderness: There is no abdominal tenderness.  Musculoskeletal:     Right lower leg: No swelling.     Left lower leg: No swelling.  Skin:    General: Skin is warm.     Findings: No rash.  Neurological:     Mental Status: She is alert and  oriented to person, place, and time.      Condition at discharge: stable  The results of significant diagnostics from this hospitalization (including imaging, microbiology, ancillary and laboratory) are listed below for reference.   Imaging Studies: DG C-Arm 1-60 Min-No Report  Result Date: 08/06/2022 Fluoroscopy was utilized by the requesting physician.  No radiographic interpretation.   MR ABDOMEN MRCP W WO CONTAST  Result Date: 08/05/2022 CLINICAL DATA:  Evaluate for gallstones.  Pancreatitis. EXAM: MRI ABDOMEN WITHOUT AND WITH CONTRAST (INCLUDING MRCP) TECHNIQUE: Multiplanar multisequence MR imaging of the abdomen was performed both before and after the administration of intravenous contrast. Heavily T2-weighted images of the biliary and pancreatic ducts were obtained, and three-dimensional MRCP images were rendered by post processing. CONTRAST:  7.83m GADAVIST GADOBUTROL 1 MMOL/ML IV SOLN COMPARISON:  Ultrasound abdomen 08/05/2022.  MRI abdomen 04/29/2018 FINDINGS: Lower chest: No acute findings. Hepatobiliary: No liver mass identified. No gallstones identified. Mild gallbladder wall thickening is identified measuring up to 3.5 mm, image 22/4. The common bile duct measures up to 9  mm, image 10/12. Mild intrahepatic bile duct dilatation. Filling defects are identified within the common bile duct compatible with choledocholithiasis. The largest is in the distal third measuring 5 mm in length, image 11/12. Pancreas: No mass, inflammatory changes, or other parenchymal abnormality identified. Spleen:  Within normal limits in size and appearance. Adrenals/Urinary Tract: No masses identified. No evidence of hydronephrosis. Stomach/Bowel: Stomach appears within normal limits. No dilated bowel loops. Vascular/Lymphatic: Upper abdominal vascularity is patent. Normal caliber abdominal aorta. No adenopathy identified. Other:  No free fluid or fluid collections identified. Musculoskeletal: No suspicious bone  lesions identified. IMPRESSION: 1. Choledocholithiasis with mild dilatation of the common bile duct and intrahepatic bile ducts. 2. Mild gallbladder wall thickening is identified. No stones noted within the lumen of the gallbladder. Electronically Signed   By: Kerby Moors M.D.   On: 08/05/2022 07:27   MR 3D Recon At Scanner  Result Date: 08/05/2022 CLINICAL DATA:  Evaluate for gallstones.  Pancreatitis. EXAM: MRI ABDOMEN WITHOUT AND WITH CONTRAST (INCLUDING MRCP) TECHNIQUE: Multiplanar multisequence MR imaging of the abdomen was performed both before and after the administration of intravenous contrast. Heavily T2-weighted images of the biliary and pancreatic ducts were obtained, and three-dimensional MRCP images were rendered by post processing. CONTRAST:  7.29m GADAVIST GADOBUTROL 1 MMOL/ML IV SOLN COMPARISON:  Ultrasound abdomen 08/05/2022.  MRI abdomen 04/29/2018 FINDINGS: Lower chest: No acute findings. Hepatobiliary: No liver mass identified. No gallstones identified. Mild gallbladder wall thickening is identified measuring up to 3.5 mm, image 22/4. The common bile duct measures up to 9 mm, image 10/12. Mild intrahepatic bile duct dilatation. Filling defects are identified within the common bile duct compatible with choledocholithiasis. The largest is in the distal third measuring 5 mm in length, image 11/12. Pancreas: No mass, inflammatory changes, or other parenchymal abnormality identified. Spleen:  Within normal limits in size and appearance. Adrenals/Urinary Tract: No masses identified. No evidence of hydronephrosis. Stomach/Bowel: Stomach appears within normal limits. No dilated bowel loops. Vascular/Lymphatic: Upper abdominal vascularity is patent. Normal caliber abdominal aorta. No adenopathy identified. Other:  No free fluid or fluid collections identified. Musculoskeletal: No suspicious bone lesions identified. IMPRESSION: 1. Choledocholithiasis with mild dilatation of the common bile duct and  intrahepatic bile ducts. 2. Mild gallbladder wall thickening is identified. No stones noted within the lumen of the gallbladder. Electronically Signed   By: TKerby MoorsM.D.   On: 08/05/2022 07:27   [place]ABDOMEN LIMITED RUQ (LIVER/GB)  Result Date: 08/05/2022 CLINICAL DATA:  Epigastric and right upper quadrant pain with nausea and vomiting. EXAM: ULTRASOUND ABDOMEN LIMITED RIGHT UPPER QUADRANT COMPARISON:  MRI abdomen 04/29/2018 FINDINGS: Gallbladder: Gallbladder is distended measuring 9.5 x 4.2 x 5.1 cm. The free wall is slightly thickened and measures 3.4 mm. There is no pericholecystic fluid or positive sonographic Murphy's sign. A solitary 7.5 mm linear echogenic structure is noted mobile within the lumen and could be a nonshadowing stone or a cholesterol crystal. No other suggestion of stones is seen. Common bile duct: Diameter: Prominent measuring 8.2 mm. Obscured by bowel gas distally. Visualized portion without filling defects. On the MRI the common bile duct was 5.4 mm. Laboratory and clinical correlation recommended. Liver: No focal lesion identified. Within normal limits in parenchymal echogenicity. Portal vein is patent on color Doppler imaging with normal direction of blood flow towards the liver. Other: None. IMPRESSION: 1. Distended gallbladder with mild wall thickening. No pericholecystic fluid or positive sonographic Murphy's sign. 2. A solitary 7.5 mm linear echogenic structure is noted mobile  within the lumen and could be a nonshadowing stone or a cholesterol crystal. 3. Prominent common bile duct measuring 8.2 mm. On the MRI the common bile duct was 5.4 mm. The distal common bile duct is obscured by bowel gas. Visualized portion without filling defects. Electronically Signed   By: Telford Nab M.D.   On: 08/05/2022 04:47    Microbiology: Results for orders placed or performed during the hospital encounter of 08/05/22  Culture, blood (Routine X 2) w Reflex to ID Panel     Status: None  (Preliminary result)   Collection Time: 08/05/22  8:38 AM   Specimen: BLOOD  Result Value Ref Range Status   Specimen Description BLOOD BLOOD LEFT ARM  Final   Special Requests   Final    BOTTLES DRAWN AEROBIC AND ANAEROBIC Blood Culture adequate volume   Culture   Final    NO GROWTH 2 DAYS Performed at [facility], [address], [place], [place] [address]    Report Status PENDING  Incomplete  Culture, blood (Routine X 2) w Reflex to ID Panel     Status: None (Preliminary result)   Collection Time: 08/05/22  8:38 AM   Specimen: BLOOD  Result Value Ref Range Status   Specimen Description BLOOD BLOOD LEFT HAND  Final   Special Requests   Final    BOTTLES DRAWN AEROBIC AND ANAEROBIC Blood Culture adequate volume   Culture   Final    NO GROWTH 2 DAYS Performed at [facility], [address] [place], [place] [address]    Report Status PENDING  Incomplete  Surgical PCR screen     Status: None   Collection Time: 08/05/22  6:30 PM   Specimen: Nasal Mucosa; Nasal Swab  Result Value Ref Range Status   MRSA, PCR NEGATIVE NEGATIVE Final   Staphylococcus aureus NEGATIVE NEGATIVE Final    Comment: (NOTE) The Xpert SA Assay (FDA approved for NASAL specimens in patients 55 years of age and older), is one component of a comprehensive surveillance program. It is not intended to diagnose infection nor to guide or monitor treatment. Performed at [facility], [address] [place], [place] [address]     Labs: CBC: Recent Labs  Lab 08/05/22 0310 08/06/22 0330 08/07/22 0322  WBC 13.2* 9.2 9.7  HGB 11.1* 9.5* 10.1*  HCT 34.3* 30.0* 31.1*  MCV 84.5 85.5 85.2  PLT 429* 364 [id number]   Basic Metabolic Panel: Recent Labs  Lab 08/05/22 0310 08/06/22 0328 08/06/22 0330 08/07/22 0322  NA 137  --  135 137  K 3.7  --  3.2* 3.5  CL 103  --  104 105  CO2 25  --  24 25  GLUCOSE 140*  --  110* 97  BUN 12  --  11 7  CREATININE 1.02*  --  1.16* 1.14*   CALCIUM 8.7*  --  8.1* 8.3*  MG 1.9 2.0  --  2.3   Liver Function Tests: Recent Labs  Lab 08/05/22 0310 08/06/22 0330 08/07/22 0322  AST 67* 40 34  ALT 43 34 46*  ALKPHOS 56 49 62  BILITOT 0.8 1.3* 1.6*  PROT 7.4 6.1* 6.3*  ALBUMIN 3.6 2.9* 3.1*   CBG: Recent Labs  Lab 08/06/22 0835 08/07/22 0843  GLUCAP 95 91    Discharge time spent: greater than 30 minutes.  Signed: Loletha Grayer, MD [facility] 08/07/2022

## 2022-08-08 ENCOUNTER — Telehealth: Payer: Self-pay | Admitting: *Deleted

## 2022-08-08 NOTE — Transitions of Care (Post Inpatient/ED Visit) (Signed)
   08/08/2022  Name: Madison Conway MRN: [id number] DOB: [birth year]  Today's TOC FU Call Status: Today's TOC FU Call Status:: Successful TOC FU Call Competed TOC FU Call Complete Date: 08/08/22  Transition Care Management Follow-up Telephone Call Date of Discharge: 08/07/22 Discharge Facility: [facility] Type of Discharge: Inpatient Admission Primary Inpatient Discharge Diagnosis:: RUQ pain How have you been since you were released from the hospital?: Better Any questions or concerns?: No  Items Reviewed: Did you receive and understand the discharge instructions provided?: Yes Medications obtained and verified?: Yes (Medications Reviewed) Any new allergies since your discharge?: No Dietary orders reviewed?: No Do you have support at home?: Yes People in Home: friend(s) Name of Support/Comfort Primary Source: Ulice Dash an mother  [place] and Equipment/Supplies: [place] Ordered?: No Any new equipment or medical supplies ordered?: No  Functional Questionnaire: Do you need assistance with bathing/showering or dressing?: No Do you need assistance with meal preparation?: No Do you need assistance with eating?: No Do you have difficulty maintaining continence: No Do you need assistance with getting out of bed/getting out of a chair/moving?: No Do you have difficulty managing or taking your medications?: No  Folllow up appointments reviewed: PCP Follow-up appointment confirmed?: No MD Provider Line Number:[phone] Given: No (Patient is going out of town and will make appointment herself when she gets back on Monday.) [facility] Follow-up appointment confirmed?: No Reason Specialist Follow-Up Not Confirmed: Patient has Specialist Provider Number and will Call for Appointment (Patient is to schedule if any other complication) Do you need transportation to your follow-up appointment?: No Do you understand care options if your condition(s) worsen?: Yes-patient  verbalized understanding  SDOH Interventions Today    Flowsheet Row Most Recent Value  SDOH Interventions   Food Insecurity Interventions Intervention Not Indicated  Housing Interventions Intervention Not Indicated  Transportation Interventions Intervention Not Indicated      Interventions Today    Flowsheet Row Most Recent Value  General Interventions   General Interventions Discussed/Reviewed General Interventions Reviewed, General Interventions Discussed  [RN discussed taking all the atbx. RN discussed drinking warm liquids to help soremess in throat from endotube.]       [facility] [phone]

## 2022-08-10 LAB — CULTURE, BLOOD (ROUTINE X 2)
Culture: NO GROWTH
Culture: NO GROWTH
Special Requests: ADEQUATE
Special Requests: ADEQUATE

## 2022-08-15 ENCOUNTER — Ambulatory Visit: Payer: BC Managed Care – PPO | Admitting: Internal Medicine

## 2022-08-15 ENCOUNTER — Encounter: Payer: Self-pay | Admitting: Internal Medicine

## 2022-08-15 VITALS — BP 130/84 | HR 104 | Temp 96.9°F | Wt 178.0 lb

## 2022-08-15 DIAGNOSIS — R9431 Abnormal electrocardiogram [ECG] [EKG]: Secondary | ICD-10-CM

## 2022-08-15 DIAGNOSIS — K8050 Calculus of bile duct without cholangitis or cholecystitis without obstruction: Secondary | ICD-10-CM

## 2022-08-15 NOTE — Progress Notes (Signed)
Subjective:    Patient ID: Madison Conway, female    DOB: September 09, 1970, 52 y.o.   MRN: [id number]  HPI  Patient presents to clinic today for TCM hospital follow-up.  She presented to the ER with complaint of abdominal pain.  Labs revealed leukocytosis.  Other labs unremarkable.  RUQ ultrasound showed:  1. Distended gallbladder with mild wall thickening. No pericholecystic fluid or positive sonographic Murphy's sign. 2. A solitary 7.5 mm linear echogenic structure is noted mobile within the lumen and could be a nonshadowing stone or a cholesterol crystal. 3. Prominent common bile duct measuring 8.2 mm. On the MRI the common bile duct was 5.4 mm. The distal common bile duct is obscured by bowel gas. Visualized portion without filling defects   GI was consulted.  MRCP showed:  1. Choledocholithiasis with mild dilatation of the common bile duct and intrahepatic bile ducts. 2. Mild gallbladder wall thickening is identified. No stones noted within the lumen of the gallbladderthat she drinks 1- 2 glasses of mixed alcohol, 3-4 times per week.    She underwent an ERCP with complete removal by biliary sphincterotomy and balloon extraction.  She refused cholecystectomy.  She was started on Augmentin.  She was discharged on 2/14.  Since that time, she denies abdominal pain, nausea, vomiting, constipation, diarrhea or blood in her stool.  She does not plan on following up with GI because they recommend that she have a cholecystectomy which she does not want to do.  During admission, her ECG showed Mobitz type II heart block. Cardiology was consulted. They wanted her to wear a hear monitor. She declined to wear a heart monitor and the ECG was not repeated.  Review of Systems  Past Medical History:  Diagnosis Date   Asthma    childhood.   HTN (hypertension)    Thrombocytosis     Current Outpatient Medications  Medication Sig Dispense Refill   ALAYCEN 1/35 tablet TAKE 1 TABLET BY MOUTH EVERY DAY  28 tablet 11   famotidine (PEPCID) 10 MG tablet Take 10 mg by mouth daily as needed for heartburn or indigestion.     ferrous sulfate (FER-IN-SOL) 75 (15 Fe) MG/ML SOLN Take 15 mg of iron by mouth 3 (three) times a week.      Multiple Vitamins-Minerals (MULTIVITAMIN ADULT PO) Take 1 tablet by mouth every other day.      olmesartan-hydrochlorothiazide (BENICAR HCT) 20-12.5 MG tablet TAKE 1 TABLET BY MOUTH EVERY DAY 90 tablet 1   No current facility-administered medications for this visit.    No Known Allergies  Family History  Problem Relation Age of Onset   Diabetes Maternal Grandmother    Breast cancer Neg Hx    Colon cancer Neg Hx    Ovarian cancer Neg Hx     Social History   Socioeconomic History   Marital status: Single    Spouse name: Not on file   Number of children: Not on file   Years of education: Not on file   Highest education level: Not on file  Occupational History   Not on file  Tobacco Use   Smoking status: Never   Smokeless tobacco: Never  Vaping Use   Vaping Use: Never used  Substance and Sexual Activity   Alcohol use: Yes    Alcohol/week: 2.0 - 3.0 standard drinks of alcohol    Types: 2 - 3 Standard drinks or equivalent per week   Drug use: No   Sexual activity: Yes  Birth control/protection: Pill  Other Topics Concern   Not on file  Social History Narrative   Not on file   Social Determinants of Health   Financial Resource Strain: Not on file  Food Insecurity: No Food Insecurity (08/08/2022)   Hunger Vital Sign    Worried About Running Out of Food in the Last Year: Never true    Ran Out of Food in the Last Year: Never true  Transportation Needs: No Transportation Needs (08/08/2022)   PRAPARE - Hydrologist (Medical): No    Lack of Transportation (Non-Medical): No  Physical Activity: Not on file  Stress: Not on file  Social Connections: Not on file  Intimate Partner Violence: Not At Risk (08/05/2022)    Humiliation, Afraid, Rape, and Kick questionnaire    Fear of Current or Ex-Partner: No    Emotionally Abused: No    Physically Abused: No    Sexually Abused: No     Constitutional: Denies fever, malaise, fatigue, headache or abrupt weight changes.  Respiratory: Denies difficulty breathing, shortness of breath, cough or sputum production.   Cardiovascular: Denies chest pain, chest tightness, palpitations or swelling in the hands or feet.  Gastrointestinal: Denies abdominal pain, bloating, constipation, diarrhea or blood in the stool.  GU: Denies urgency, frequency, pain with urination, burning sensation, blood in urine, odor or discharge. Neurological: Denies dizziness, difficulty with memory, difficulty with speech or problems with balance and coordination.   No other specific complaints in a complete review of systems (except as listed in HPI above).     Objective:   Physical Exam   BP 130/84 (BP Location: Right Arm, Patient Position: Sitting, Cuff Size: Normal)   Pulse (!) 104   Temp (!) 96.9 F (36.1 C) (Temporal)   Wt 178 lb (80.7 kg)   SpO2 100%   BMI 30.08 kg/m   Wt Readings from Last 3 Encounters:  08/06/22 183 lb (83 kg)  03/05/22 181 lb (82.1 kg)  05/31/21 182 lb (82.6 kg)    General: Appears her stated age, obese in NAD. Cardiovascular: Tachycardic with normal rhythm. S1,S2 noted.  No murmur, rubs or gallops noted.  Pulmonary/Chest: Normal effort and positive vesicular breath sounds. No respiratory distress. No wheezes, rales or ronchi noted.  Abdomen: Soft and nontender. Normal bowel sounds. No distention or masses noted.  Musculoskeletal: No difficulty with gait.  Neurological: Alert and oriented. Coordination normal.     BMET    Component Value Date/Time   NA 137 08/07/2022 0322   NA 138 07/09/2016 1448   K 3.5 08/07/2022 0322   CL 105 08/07/2022 0322   CO2 25 08/07/2022 0322   GLUCOSE 97 08/07/2022 0322   BUN 7 08/07/2022 0322   BUN 8 03/04/2017  0000   CREATININE 1.14 (H) 08/07/2022 0322   CREATININE 1.03 (H) 03/05/2022 0828   CALCIUM 8.3 (L) 08/07/2022 0322   GFRNONAA 59 (L) 08/07/2022 0322   GFRNONAA 66 03/17/2020 1007   GFRAA 77 03/17/2020 1007    Lipid Panel     Component Value Date/Time   CHOL 228 (H) 03/05/2022 0828   CHOL 195 02/28/2016 0000   TRIG 118 03/05/2022 0828   TRIG 86 02/28/2016 0000   HDL 138 03/05/2022 0828   CHOLHDL 1.7 03/05/2022 0828   LDLCALC 70 03/05/2022 0828    CBC    Component Value Date/Time   WBC 9.7 08/07/2022 0322   RBC 3.65 (L) 08/07/2022 0322   HGB 10.1 (L) 08/07/2022  0322   HGB 11.5 05/31/2021 1628   HCT 31.1 (L) 08/07/2022 0322   HCT 35.1 05/31/2021 1628   PLT 399 08/07/2022 0322   PLT 491 (H) 05/31/2021 1628   MCV 85.2 08/07/2022 0322   MCV 82 05/31/2021 1628   MCH 27.7 08/07/2022 0322   MCHC 32.5 08/07/2022 0322   RDW 15.2 08/07/2022 0322   RDW 13.5 05/31/2021 1628   LYMPHSABS 1,853 03/17/2020 1007   LYMPHSABS 2.2 03/29/2019 1536   MONOABS 0.6 04/13/2018 1106   EOSABS 362 03/17/2020 1007   EOSABS 0.2 03/29/2019 1536   BASOSABS 57 03/17/2020 1007   BASOSABS 0.0 03/29/2019 1536    Hgb A1C Lab Results  Component Value Date   HGBA1C 5.0 03/22/2021           Assessment & Plan:   TCM hospital follow-up for Choledocholithiasis:  Hospital notes, labs and imaging reviewed She has completed her course of Augmentin as prescribed No indication for repeat labs at this time She does not plan to follow-up with GI  Abnormal ECG:  Indication for ECG: Abnormal ECG in hospital Interpretation of ECG: normal rate and rhythm, no evidence of heart block Comparison of ECG: 08/07/2022, no evidence of heart block No indication for cardiology follow up or Holter monitor at this time  RTC in 2 month for follow-up of chronic conditions Webb Silversmith, NP

## 2022-08-15 NOTE — Patient Instructions (Signed)
Second-Degree Atrioventricular Block Second-degree atrioventricular (AV) block is a condition that can cause the heart to miss beats. In this condition, the electrical signals that travel from the heart's upper chambers (atria) to its lower chambers (ventricles) move too slowly or are interrupted. There are two types of second-degree AV block: Mobitz type 1 block. This type does not cause symptoms. It rarely requires treatment. Mobitz type 2 block. This type is more serious. It results in more missed beats and a very irregular heartbeat. It can lead to third-degree AV block, or complete heart block, meaning that no signal exists between the atria and ventricles. Mobitz type 2 is a dangerous condition that can lead to fainting or cardiac arrest. Mobitz type 2 block often requires a pacemaker. What are the causes? This condition may be caused by: Any condition that damages the electrical pathway that controls the heart's rate and rhythm, such as a heart attack or infection of the heart. Overstimulation of the nerve that slows down the heart rate (vagus nerve). This cause is common among well-conditioned athletes. Some medicines that slow down the heart rate, such as beta blockers or calcium channel blockers. Surgery that damages the heart. Some people are born with this condition (congenital heart block), but most people develop it over time. What increases the risk? The risk for this condition increases with age. You are also more likely to develop this condition if you have: A history of heart attack. Heart failure. Coronary heart disease. Inflammation of heart muscle (myocarditis). Disease of heart muscle (cardiomyopathy). Infection of the heart valves (endocarditis). Infections or diseases that affect the heart. These include: Lyme disease. Sarcoidosis. Hemochromatosis. Rheumatic fever. Certain muscle disorders. Babies are more likely to be born with heart block if: The baby's mother has  an autoimmune disease, such as lupus. The baby is born with a heart defect that affects the heart's structure. A parent was born with a heart defect. What are the signs or symptoms? Symptoms of this condition include: Tiredness. Shortness of breath. Dizziness. Light-headedness. Fainting. Chest pain. How is this diagnosed? This condition may be diagnosed based on: A physical exam. Your medical history. A measurement of your pulse or heartbeat. Tests. These may include: An electrocardiogram (ECG). This checks for problems with electrical activity in your heart. Ambulatory cardiac monitoring. This is a portable ECG that you wear. It checks your heart's rhythm. An electrophysiology (EP) study. Long, thin tubes (catheters) are placed in your heart. The catheters give information about your heart's electrical signals. How is this treated? Treatment for this condition depends on the type of block you have and how severe it is. Treatment may involve: Treating an underlying condition, such as heart disease. Changing or stopping any heart medicines that may have caused heart block. Having a permanent pacemaker placed in your chest. A pacemaker uses electrical pulses to help the heart beat normally. It is usually placed under the skin on your chest. You will likely need a pacemaker if you have Mobitz type 2 block. Follow these instructions at home: Alcohol use Do not drink alcohol if: Your health care provider tells you not to drink. You are pregnant, may be pregnant, or are planning to become pregnant. If you drink alcohol: Limit how much you use to: 0-1 drink a day for women. 0-2 drinks a day for men. Be aware of how much alcohol is in your drink. In the [place] one drink equals one 12 oz bottle of beer (355 mL), one 5 oz glass of  wine (148 mL), or one 1 oz glass of hard liquor (44 mL). General instructions  Take over-the-counter and prescription medicines only as told by your health care  provider. Follow your health care provider's recommendations to help reduce your risk of heart disease. These may include: Exercising for at least 30 minutes on 5 or more days each week (150 minutes). Ask your health care provider what type of exercise is safe for you. Eating a heart-healthy diet with fruits and vegetables, whole grains, low-fat dairy products, and lean proteins like poultry and eggs. Your health care provider or dietitian can help you make healthy choices. Maintaining a healthy weight. Do not use any products that contain nicotine or tobacco, such as cigarettes, e-cigarettes. and chewing tobacco. If you need help quitting, ask your health care provider. Keep all follow-up visits as told by your health care provider. This is important. Where to find more information American Heart Association: www.heart.org National Heart, Lung, and Blood Institute: https://wilson-eaton.com/ Contact a health care provider if you: Feel like your heart is skipping beats. Feel more tired than normal. Have swelling in your lower legs or your feet. Get help right away if you: Have symptoms that change or get worse. Develop new symptoms. Have chest pain, especially if the pain: Feels like crushing or pressure. Spreads to your arms, back, neck, or jaw. Feel short of breath. Feel light-headed or weak. Faint. These symptoms may represent a serious problem that is an emergency. Do not wait to see if the symptoms will go away. Get medical help right away. Call your local emergency services (911 in the [place] Do not drive yourself to the hospital. Summary Second-degree atrioventricular (AV) block is a type of heart block that can cause the heart to miss beats. In this condition, the electrical signals that control heart rate move too slowly or are interrupted. You will likely need a pacemaker if you have the more serious type of AV block (Mobitz type 2 block). Get help right away if you have symptoms that  change or get worse, have chest pain, feel short of breath or light-headed or weak, faint, or develop new symptoms. This information is not intended to replace advice given to you by your health care provider. Make sure you discuss any questions you have with your health care provider. Document Revised: 04/19/2019 Document Reviewed: 04/19/2019 Elsevier Patient Education  [place].

## 2022-08-16 NOTE — Addendum Note (Signed)
Addended by: Jearld Fenton on: 08/16/2022 09:52 AM   Modules accepted: Level of Service

## 2022-08-20 ENCOUNTER — Telehealth: Payer: Self-pay

## 2022-08-20 NOTE — Telephone Encounter (Unsigned)
Copied from [place] [id number]. Topic: General - Other >> Aug 20, 2022  3:45 PM Madison Conway wrote: Reason for CRM: Pt states that she received Conway heart monitor in the mail and wanting to know if she need to use it. She received it from the [facility] would like for her PCP to let her know if she should use it or not.  Please advise

## 2022-08-21 NOTE — Telephone Encounter (Signed)
Her ECG here in the office was normal.  However if she wants to wear the heart monitor for follow-up she can do so.

## 2022-08-21 NOTE — Telephone Encounter (Signed)
Pt advised.   Thanks,   -Zaniya Mcaulay  

## 2022-09-08 DIAGNOSIS — I441 Atrioventricular block, second degree: Secondary | ICD-10-CM

## 2022-09-23 ENCOUNTER — Other Ambulatory Visit: Payer: Self-pay | Admitting: Internal Medicine

## 2022-09-23 NOTE — Telephone Encounter (Signed)
Requested Prescriptions  Pending Prescriptions Disp Refills   olmesartan-hydrochlorothiazide (BENICAR HCT) 20-12.5 MG tablet [Pharmacy Med Name: OLMESARTAN-HCTZ 20-12.5 MG TAB] 90 tablet 0    Sig: TAKE 1 TABLET BY MOUTH EVERY DAY     Cardiovascular: ARB + Diuretic Combos Failed - 09/23/2022  2:29 AM      Failed - Cr in normal range and within 180 days    Creat  Date Value Ref Range Status  03/05/2022 1.03 (H) 0.50 - 0.99 mg/dL Final   Creatinine, Ser  Date Value Ref Range Status  08/07/2022 1.14 (H) 0.44 - 1.00 mg/dL Final         Passed - K in normal range and within 180 days    Potassium  Date Value Ref Range Status  08/07/2022 3.5 3.5 - 5.1 mmol/L Final         Passed - Na in normal range and within 180 days    Sodium  Date Value Ref Range Status  08/07/2022 137 135 - 145 mmol/L Final  07/09/2016 138 134 - 144 mmol/L Final         Passed - eGFR is 10 or above and within 180 days    GFR, Est African American  Date Value Ref Range Status  03/17/2020 77 > OR = 60 mL/min/1.46m2 Final   GFR, Est Non African American  Date Value Ref Range Status  03/17/2020 66 > OR = 60 mL/min/1.57m2 Final   GFR, Estimated  Date Value Ref Range Status  08/07/2022 59 (L) >60 mL/min Final    Comment:    (NOTE) Calculated using the CKD-EPI Creatinine Equation (2021)    eGFR  Date Value Ref Range Status  03/05/2022 67 > OR = 60 mL/min/1.34m2 Final         Passed - Patient is not pregnant      Passed - Last BP in normal range    BP Readings from Last 1 Encounters:  08/15/22 130/84         Passed - Valid encounter within last 6 months    Recent Outpatient Visits           1 month ago Choledocholithiasis   [place], Coralie Keens, NP   6 months ago Encounter for general adult medical examination with abnormal findings   [facility] [place], Coralie Keens, NP   1 year ago Encounter for general adult medical examination with  abnormal findings   [facility] [place], Coralie Keens, NP   2 years ago Annual physical exam   [facility] [place], Lupita Raider, FNP   3 years ago Annual physical exam   [facility] Puma, Jerrel Ivory, NP       Future Appointments             In 1 week Garnette Gunner, Coralie Keens, NP [facility]

## 2022-09-26 DIAGNOSIS — I441 Atrioventricular block, second degree: Secondary | ICD-10-CM | POA: Diagnosis not present

## 2022-10-03 ENCOUNTER — Telehealth: Payer: Self-pay

## 2022-10-03 NOTE — Progress Notes (Signed)
Average heart rate of 74 beats per minute, one episode of an elevated heart rate, rare early beats noted, during hospital visit there was concern for Type 2 heart block, this was not noted on the monitor. Reassuring study.

## 2022-10-03 NOTE — Telephone Encounter (Signed)
Blood only, no urine

## 2022-10-03 NOTE — Telephone Encounter (Signed)
Copied from CRM [id number]. Topic: General - Inquiry >> Oct 03, 2022  9:09 AM [place] wrote: Reason for CRM: Pt stated that at her upcoming appointment tomorrow, Ms.Baity will be checking her creatinine to check her kidneys. Pt is asking if this is a blood or urine test. She stated she struggles to urinate when she comes in early in the in the morning and would like to pick up a specimen cup if the test is a urine test.  Please advise.

## 2022-10-03 NOTE — Telephone Encounter (Signed)
Pt advised.   Thanks,   -Zhane Bluitt  

## 2022-10-03 NOTE — Telephone Encounter (Signed)
Will you need a urine sample for anything?

## 2022-10-04 ENCOUNTER — Encounter: Payer: Self-pay | Admitting: Internal Medicine

## 2022-10-04 ENCOUNTER — Ambulatory Visit: Payer: BC Managed Care – PPO | Admitting: Internal Medicine

## 2022-10-04 VITALS — BP 134/82 | HR 91 | Temp 96.4°F | Wt 179.0 lb

## 2022-10-04 DIAGNOSIS — N183 Chronic kidney disease, stage 3 unspecified: Secondary | ICD-10-CM

## 2022-10-04 DIAGNOSIS — K219 Gastro-esophageal reflux disease without esophagitis: Secondary | ICD-10-CM

## 2022-10-04 DIAGNOSIS — I10 Essential (primary) hypertension: Secondary | ICD-10-CM

## 2022-10-04 DIAGNOSIS — D500 Iron deficiency anemia secondary to blood loss (chronic): Secondary | ICD-10-CM

## 2022-10-04 DIAGNOSIS — R002 Palpitations: Secondary | ICD-10-CM | POA: Diagnosis not present

## 2022-10-04 DIAGNOSIS — Z136 Encounter for screening for cardiovascular disorders: Secondary | ICD-10-CM

## 2022-10-04 DIAGNOSIS — Z6830 Body mass index (BMI) 30.0-30.9, adult: Secondary | ICD-10-CM

## 2022-10-04 DIAGNOSIS — E6609 Other obesity due to excess calories: Secondary | ICD-10-CM

## 2022-10-04 DIAGNOSIS — J4520 Mild intermittent asthma, uncomplicated: Secondary | ICD-10-CM

## 2022-10-04 MED ORDER — OLMESARTAN MEDOXOMIL-HCTZ 20-12.5 MG PO TABS: 1.0000 | ORAL_TABLET | Freq: Every day | ORAL | 0 refills | Status: DC

## 2022-10-04 NOTE — Assessment & Plan Note (Signed)
Controlled on olmesartan HCT Reinforced DASH diet and exercise for weight loss C-Met today 

## 2022-10-04 NOTE — Assessment & Plan Note (Signed)
Currently not an issue 

## 2022-10-04 NOTE — Assessment & Plan Note (Signed)
CBC and iron panel today 

## 2022-10-04 NOTE — Assessment & Plan Note (Signed)
Encourage diet and exercise for weight loss 

## 2022-10-04 NOTE — Patient Instructions (Signed)
Iron-Rich Diet ? ?Iron is a mineral that helps your body produce hemoglobin. Hemoglobin is a protein in red blood cells that carries oxygen to your body's tissues. Eating too little iron may cause you to feel weak and tired, and it can increase your risk of infection. Iron is naturally found in many foods, and many foods have iron added to them (are iron-fortified). ?You may need to follow an iron-rich diet if you do not have enough iron in your body due to certain medical conditions. The amount of iron that you need each day depends on your age, your sex, and any medical conditions you have. Follow instructions from your health care provider or a dietitian about how much iron you should eat each day. ?What are tips for following this plan? ?Reading food labels ?Check food labels to see how many milligrams (mg) of iron are in each serving. ?Cooking ?Cook foods in pots and pans that are made from iron. ?Take these steps to make it easier for your body to absorb iron from certain foods: ?Soak beans overnight before cooking. ?Soak whole grains overnight and drain them before using. ?Ferment flours before baking, such as by using yeast in bread dough. ?Meal planning ?When you eat foods that contain iron, you should eat them with foods that are high in vitamin C. These include oranges, peppers, tomatoes, potatoes, and mangoes. Vitamin C helps your body absorb iron. ?Certain foods and drinks prevent your body from absorbing iron properly. Avoid eating these foods in the same meal as iron-rich foods or with iron supplements. These foods include: ?Coffee, black tea, and red wine. ?Milk, dairy products, and foods that are high in calcium. ?Beans and soybeans. ?Whole grains. ?General information ?Take iron supplements only as told by your health care provider. An overdose of iron can be life-threatening. If you were prescribed iron supplements, take them with orange juice or a vitamin C supplement. ?When you eat  iron-fortified foods or take an iron supplement, you should also eat foods that naturally contain iron, such as meat, poultry, and fish. Eating naturally iron-rich foods helps your body absorb the iron that is added to other foods or contained in a supplement. ?Iron from animal sources is better absorbed than iron from plant sources. ?What foods should I eat? ?Fruits ?Prunes. Raisins. ?Eat fruits high in vitamin C, such as oranges, grapefruits, and strawberries, with iron-rich foods. ?Vegetables ?Spinach (cooked). Green peas. Broccoli. Fermented vegetables. ?Eat vegetables high in vitamin C, such as leafy greens, potatoes, bell peppers, and tomatoes, with iron-rich foods. ?Grains ?Iron-fortified breakfast cereal. Iron-fortified whole-wheat bread. Enriched rice. Sprouted grains. ?Meats and other proteins ?Beef liver. Beef. Turkey. Chicken. Oysters. Shrimp. Tuna. Sardines. Chickpeas. Nuts. Tofu. Pumpkin seeds. ?Beverages ?Tomato juice. Fresh orange juice. Prune juice. Hibiscus tea. Iron-fortified instant breakfast shakes. ?Sweets and desserts ?Blackstrap molasses. ?Seasonings and condiments ?Tahini. Fermented soy sauce. ?Other foods ?Wheat germ. ?The items listed above may not be a complete list of recommended foods and beverages. Contact a dietitian for more information. ?What foods should I limit? ?These are foods that should be limited while eating iron-rich foods as they can reduce the absorption of iron in your body. ?Grains ?Whole grains. Bran cereal. Bran flour. ?Meats and other proteins ?Soybeans. Products made from soy protein. Black beans. Lentils. Mung beans. Split peas. ?Dairy ?Milk. Cream. Cheese. Yogurt. Cottage cheese. ?Beverages ?Coffee. Black tea. Red wine. ?Sweets and desserts ?Cocoa. Chocolate. Ice cream. ?Seasonings and condiments ?Basil. Oregano. Large amounts of parsley. ?The items listed   above may not be a complete list of foods and beverages you should limit. Contact a dietitian for more  information. ?Summary ?Iron is a mineral that helps your body produce hemoglobin. Hemoglobin is a protein in red blood cells that carries oxygen to your body's tissues. ?Iron is naturally found in many foods, and many foods have iron added to them (are iron-fortified). ?When you eat foods that contain iron, you should eat them with foods that are high in vitamin C. Vitamin C helps your body absorb iron. ?Certain foods and drinks prevent your body from absorbing iron properly, such as whole grains and dairy products. You should avoid eating these foods in the same meal as iron-rich foods or with iron supplements. ?This information is not intended to replace advice given to you by your health care provider. Make sure you discuss any questions you have with your health care provider. ?Document Revised: 05/22/2020 Document Reviewed: 05/22/2020 ?Elsevier Patient Education ? 2023 Elsevier Inc. ? ?

## 2022-10-04 NOTE — Assessment & Plan Note (Signed)
Avoid foods that trigger reflux Encouraged weight loss as this can help reduce reflux symptoms Okay to continue famotidine as needed

## 2022-10-04 NOTE — Progress Notes (Signed)
Subjective:    Patient ID: Madison Conway, female    DOB: 1971-06-01, 52 y.o.   MRN: [id number]  HPI  Patient presents to the clinic today for 91-month follow-up of chronic conditions.  HTN: Her BP today is 134/82.  She is taking Olmesartan HCT as prescribed.  ECG from 07/2022 reviewed.  Iron Deficiency Anemia: Her last H/H was 10.1/31.1, 07/2022.  She is  not taking oral Iron as prescribed but has increased her iron containing foods.  She does not follow with hematology.  GERD: Triggered by acidic foods.  She takes Famotidine as needed.  There is no upper GI on file.  CKD 3: Her last creatinine was 1.14, GFR 59, 07/2022.  She is on Olmesartan for renal protection.  She does not follow with nephrology.  Review of Systems     Past Medical History:  Diagnosis Date   Asthma    childhood.   HTN (hypertension)    Thrombocytosis     Current Outpatient Medications  Medication Sig Dispense Refill   ALAYCEN 1/35 tablet TAKE 1 TABLET BY MOUTH EVERY DAY 28 tablet 11   famotidine (PEPCID) 10 MG tablet Take 10 mg by mouth daily as needed for heartburn or indigestion.     ferrous sulfate (FER-IN-SOL) 75 (15 Fe) MG/ML SOLN Take 15 mg of iron by mouth 3 (three) times a week.      Multiple Vitamins-Minerals (MULTIVITAMIN ADULT PO) Take 1 tablet by mouth every other day.      olmesartan-hydrochlorothiazide (BENICAR HCT) 20-12.5 MG tablet TAKE 1 TABLET BY MOUTH EVERY DAY 90 tablet 0   No current facility-administered medications for this visit.    No Known Allergies  Family History  Problem Relation Age of Onset   Diabetes Maternal Grandmother    Breast cancer Neg Hx    Colon cancer Neg Hx    Ovarian cancer Neg Hx     Social History   Socioeconomic History   Marital status: Single    Spouse name: Not on file   Number of children: Not on file   Years of education: Not on file   Highest education level: Some college, no degree  Occupational History   Not on file  Tobacco Use    Smoking status: Never   Smokeless tobacco: Never  Vaping Use   Vaping Use: Never used  Substance and Sexual Activity   Alcohol use: Yes    Alcohol/week: 2.0 - 3.0 standard drinks of alcohol    Types: 2 - 3 Standard drinks or equivalent per week   Drug use: No   Sexual activity: Yes    Birth control/protection: Pill  Other Topics Concern   Not on file  Social History Narrative   Not on file   Social Determinants of Health   Financial Resource Strain: Low Risk  (09/30/2022)   Overall Financial Resource Strain (CARDIA)    Difficulty of Paying Living Expenses: Not hard at all  Food Insecurity: No Food Insecurity (09/30/2022)   Hunger Vital Sign    Worried About Running Out of Food in the Last Year: Never true    Ran Out of Food in the Last Year: Never true  Transportation Needs: No Transportation Needs (09/30/2022)   PRAPARE - Administrator, Civil Service (Medical): No    Lack of Transportation (Non-Medical): No  Physical Activity: Insufficiently Active (09/30/2022)   Exercise Vital Sign    Days of Exercise per Week: 4 days    Minutes of  Exercise per Session: 20 min  Stress: No Stress Concern Present (09/30/2022)   [employer] of Occupational Health - Occupational Stress Questionnaire    Feeling of Stress : Not at all  Social Connections: Moderately Isolated (09/30/2022)   Social Connection and Isolation Panel [NHANES]    Frequency of Communication with Friends and Family: More than three times a week    Frequency of Social Gatherings with Friends and Family: Twice a week    Attends Religious Services: More than 4 times per year    Active Member of [employer] or Organizations: No    Attends Engineer, structural: Not on file    Marital Status: Never married  Intimate Partner Violence: Not At Risk (08/05/2022)   Humiliation, Afraid, Rape, and Kick questionnaire    Fear of Current or Ex-Partner: No    Emotionally Abused: No    Physically Abused: No    Sexually  Abused: No     Constitutional: Denies fever, malaise, fatigue, headache or abrupt weight changes.  HEENT: Denies eye pain, eye redness, ear pain, ringing in the ears, wax buildup, runny nose, nasal congestion, bloody nose, or sore throat. Respiratory: Denies difficulty breathing, shortness of breath, cough or sputum production.   Cardiovascular: Patient admitted palpitations.  Denies chest pain, chest tightness, or swelling in the hands or feet.  Gastrointestinal: Denies abdominal pain, bloating, constipation, diarrhea or blood in the stool.  GU: Denies urgency, frequency, pain with urination, burning sensation, blood in urine, odor or discharge. Musculoskeletal: Denies decrease in range of motion, difficulty with gait, muscle pain or joint pain and swelling.  Skin: Denies redness, rashes, lesions or ulcercations.  Neurological: Denies dizziness, difficulty with memory, difficulty with speech or problems with balance and coordination.  Psych: Denies anxiety, depression, SI/HI.  No other specific complaints in a complete review of systems (except as listed in HPI above).  Objective:   Physical Exam   BP 134/82 (BP Location: Left Arm, Patient Position: Sitting, Cuff Size: Normal)   Pulse 91   Temp (!) 96.4 F (35.8 C) (Temporal)   Wt 179 lb (81.2 kg)   SpO2 100%   BMI 30.25 kg/m   Wt Readings from Last 3 Encounters:  08/15/22 178 lb (80.7 kg)  08/06/22 183 lb (83 kg)  03/05/22 181 lb (82.1 kg)    General: Appears her stated age, obese in NAD. Skin: Warm, dry and intact.  HEENT: Head: normal shape and size; Eyes: sclera white, no icterus, conjunctiva pink, PERRLA and EOMs intact;  Neck:  Neck supple, trachea midline. No masses, lumps or thyromegaly present.  Cardiovascular: Normal rate and rhythm. S1,S2 noted.  No murmur, rubs or gallops noted. No JVD or BLE edema. No carotid bruits noted. Pulmonary/Chest: Normal effort and positive vesicular breath sounds. No respiratory  distress. No wheezes, rales or ronchi noted.  Abdomen: Soft and nontender.  Musculoskeletal: Strength 5/5 BUE/BLE.  No difficulty with gait.  Neurological: Alert and oriented. Cranial nerves II-XII grossly intact. Coordination normal.  Psychiatric: Mood and affect normal. Behavior is normal. Judgment and thought content normal.     BMET    Component Value Date/Time   NA 137 08/07/2022 0322   NA 138 07/09/2016 1448   K 3.5 08/07/2022 0322   CL 105 08/07/2022 0322   CO2 25 08/07/2022 0322   GLUCOSE 97 08/07/2022 0322   BUN 7 08/07/2022 0322   BUN 8 03/04/2017 0000   CREATININE 1.14 (H) 08/07/2022 0322   CREATININE 1.03 (H) 03/05/2022 1610  CALCIUM 8.3 (L) 08/07/2022 0322   GFRNONAA 59 (L) 08/07/2022 0322   GFRNONAA 66 03/17/2020 1007   GFRAA 77 03/17/2020 1007    Lipid Panel     Component Value Date/Time   CHOL 228 (H) 03/05/2022 0828   CHOL 195 02/28/2016 0000   TRIG 118 03/05/2022 0828   TRIG 86 02/28/2016 0000   HDL 138 03/05/2022 0828   CHOLHDL 1.7 03/05/2022 0828   LDLCALC 70 03/05/2022 0828    CBC    Component Value Date/Time   WBC 9.7 08/07/2022 0322   RBC 3.65 (L) 08/07/2022 0322   HGB 10.1 (L) 08/07/2022 0322   HGB 11.5 05/31/2021 1628   HCT 31.1 (L) 08/07/2022 0322   HCT 35.1 05/31/2021 1628   PLT 399 08/07/2022 0322   PLT 491 (H) 05/31/2021 1628   MCV 85.2 08/07/2022 0322   MCV 82 05/31/2021 1628   MCH 27.7 08/07/2022 0322   MCHC 32.5 08/07/2022 0322   RDW 15.2 08/07/2022 0322   RDW 13.5 05/31/2021 1628   LYMPHSABS 1,853 03/17/2020 1007   LYMPHSABS 2.2 03/29/2019 1536   MONOABS 0.6 04/13/2018 1106   EOSABS 362 03/17/2020 1007   EOSABS 0.2 03/29/2019 1536   BASOSABS 57 03/17/2020 1007   BASOSABS 0.0 03/29/2019 1536    Hgb A1C Lab Results  Component Value Date   HGBA1C 5.0 03/22/2021          Assessment & Plan:   Palpitations:  Recent Holter monitor reviewed-normal findings Will check TSH  RTC in 6 months for annual  exam Nicki Reaper, NP

## 2022-10-04 NOTE — Assessment & Plan Note (Signed)
Will repeat c-Met today Encouraged adequate water intake Avoid anti-inflammatories OTC

## 2022-10-05 LAB — IRON,TIBC AND FERRITIN PANEL
%SAT: 10 % (calc) — ABNORMAL LOW (ref 16–45)
Ferritin: 26 ng/mL (ref 16–232)
Iron: 40 ug/dL — ABNORMAL LOW (ref 45–160)
TIBC: 401 mcg/dL (calc) (ref 250–450)

## 2022-10-05 LAB — COMPLETE METABOLIC PANEL WITH GFR
AG Ratio: 1.2 (calc) (ref 1.0–2.5)
ALT: 19 U/L (ref 6–29)
AST: 15 U/L (ref 10–35)
Albumin: 4 g/dL (ref 3.6–5.1)
Alkaline phosphatase (APISO): 56 U/L (ref 37–153)
BUN/Creatinine Ratio: 9 (calc) (ref 6–22)
BUN: 10 mg/dL (ref 7–25)
CO2: 26 mmol/L (ref 20–32)
Calcium: 9.6 mg/dL (ref 8.6–10.4)
Chloride: 100 mmol/L (ref 98–110)
Creat: 1.06 mg/dL — ABNORMAL HIGH (ref 0.50–1.03)
Globulin: 3.4 g/dL (calc) (ref 1.9–3.7)
Glucose, Bld: 95 mg/dL (ref 65–99)
Potassium: 4.3 mmol/L (ref 3.5–5.3)
Sodium: 136 mmol/L (ref 135–146)
Total Bilirubin: 0.8 mg/dL (ref 0.2–1.2)
Total Protein: 7.4 g/dL (ref 6.1–8.1)
eGFR: 64 mL/min/{1.73_m2} (ref >=60)

## 2022-10-05 LAB — CBC
HCT: 36.5 % (ref 35.0–45.0)
Hemoglobin: 11.6 g/dL — ABNORMAL LOW (ref 11.7–15.5)
MCH: 27 pg (ref 27.0–33.0)
MCHC: 31.8 g/dL — ABNORMAL LOW (ref 32.0–36.0)
MCV: 85.1 fL (ref 80.0–100.0)
MPV: 8.3 fL (ref 7.5–12.5)
Platelets: 539 10*3/uL — ABNORMAL HIGH (ref 140–400)
RBC: 4.29 10*6/uL (ref 3.80–5.10)
RDW: 13.9 % (ref 11.0–15.0)
WBC: 7.9 10*3/uL (ref 3.8–10.8)

## 2022-10-05 LAB — LIPID PANEL
Cholesterol: 226 mg/dL — ABNORMAL HIGH (ref <200)
HDL: 124 mg/dL (ref >=50)
LDL Cholesterol (Calc): 77 mg/dL (calc)
Non-HDL Cholesterol (Calc): 102 mg/dL (calc) (ref <130)
Total CHOL/HDL Ratio: 1.8 (calc) (ref <5.0)
Triglycerides: 155 mg/dL — ABNORMAL HIGH (ref <150)

## 2022-10-05 LAB — TSH: TSH: 0.94 mIU/L

## 2023-01-14 ENCOUNTER — Encounter: Payer: Self-pay | Admitting: Internal Medicine

## 2023-01-14 ENCOUNTER — Other Ambulatory Visit: Payer: Self-pay | Admitting: Internal Medicine

## 2023-01-14 DIAGNOSIS — Z1231 Encounter for screening mammogram for malignant neoplasm of breast: Secondary | ICD-10-CM

## 2023-01-22 ENCOUNTER — Ambulatory Visit: Payer: Self-pay | Admitting: *Deleted

## 2023-01-22 DIAGNOSIS — K130 Diseases of lips: Secondary | ICD-10-CM | POA: Diagnosis not present

## 2023-01-22 NOTE — Telephone Encounter (Signed)
Summary: Lips swelling   Bump on lip, lips now swelling. Seeking appt today.  Best contact: [phone]         Reason for Disposition  Lip swelling lasts > 3 days  Answer Assessment - Initial Assessment Questions 1. ONSET: "When did the swelling start?" (e.g., minutes, hours, days)     Monday morning small bump 2. SEVERITY: "How swollen is it?"     Started smaller than ime- thought acne- this morning swelling larger 3. ITCHING: "Is there any itching?" If Yes, ask: "How much?"   (Scale 1-10; mild, moderate or severe)     no 4. PAIN: "Is the swelling painful to touch?" If Yes, ask: "How painful is it?"   (Scale 1-10; mild, moderate or severe)     no 5. CAUSE: "What do you think is causing the lip swelling?"     unsure 6. RECURRENT SYMPTOM: "Have you had lip swelling before?" If Yes, ask: "When was the last time?" "What happened that time?"     no 7. OTHER SYMPTOMS: "Do you have any other symptoms?" (e.g., toothache)     no  Protocols used: Lip Swelling-A-AH

## 2023-01-22 NOTE — Telephone Encounter (Signed)
  Chief Complaint: lip swelling Symptoms: small area of swelling lip and area above- dime size-red Frequency: started Monday am Pertinent Negatives: Patient denies itching, pain Disposition: [] ED /[x] Urgent Care (no appt availability in office) / [] Appointment(In office/virtual)/ []  [facility]/ [] Home Care/ [] Refused Recommended Disposition /[] [facility]/ []  Follow-up with PCP Additional Notes: Patient states she has tried to wait it out - but not getting better- no appointment in office, attempted to call office got VM- patient will go to UC for evaluation.

## 2023-01-22 NOTE — Telephone Encounter (Signed)
noted 

## 2023-01-28 ENCOUNTER — Encounter: Payer: Self-pay | Admitting: Internal Medicine

## 2023-01-28 ENCOUNTER — Telehealth: Payer: BC Managed Care – PPO | Admitting: Internal Medicine

## 2023-01-28 DIAGNOSIS — L989 Disorder of the skin and subcutaneous tissue, unspecified: Secondary | ICD-10-CM

## 2023-01-28 NOTE — Progress Notes (Signed)
Virtual Visit via Video Note  I connected with Madison Conway on 01/28/23 at  3:40 PM EDT by a video enabled telemedicine application and verified that I am speaking with the correct person using two identifiers.  Location: Patient: Home Provider: Home  Persons participating in this video call: Nicki Reaper, NP and Lanora Manis    I discussed the limitations of evaluation and management by telemedicine and the availability of in person appointments. The patient expressed understanding and agreed to proceed.  History of Present Illness:  Patient reports a lesion between her nose and her lip.She noticed this about 10 days ago. It started out as a little scratch but it had a burning sensation. It eventually turned into a bump. She was evaluated at urgent care for the same. She was treated for a cold sore vs infected bug bite. A viral swab was obtained, which was negative for viral infection.  She was prescribed valacyclovir but did not pick this medication up when she realized the viral swab was negative.  She was treated with cephalexin and mupiricon.  She reports the area has improved but there is still a scab in the area that has not gone away.   Past Medical History:  Diagnosis Date   Asthma    childhood.   HTN (hypertension)    Thrombocytosis     Current Outpatient Medications  Medication Sig Dispense Refill   ALAYCEN 1/35 tablet TAKE 1 TABLET BY MOUTH EVERY DAY 28 tablet 11   famotidine (PEPCID) 10 MG tablet Take 10 mg by mouth daily as needed for heartburn or indigestion.     ferrous sulfate (FER-IN-SOL) 75 (15 Fe) MG/ML SOLN Take 15 mg of iron by mouth 3 (three) times a week.      Multiple Vitamins-Minerals (MULTIVITAMIN ADULT PO) Take 1 tablet by mouth every other day.      olmesartan-hydrochlorothiazide (BENICAR HCT) 20-12.5 MG tablet Take 1 tablet by mouth daily. 90 tablet 0   No current facility-administered medications for this visit.    No Known  Allergies  Family History  Problem Relation Age of Onset   Diabetes Maternal Grandmother    Breast cancer Neg Hx    Colon cancer Neg Hx    Ovarian cancer Neg Hx     Social History   Socioeconomic History   Marital status: Single    Spouse name: Not on file   Number of children: Not on file   Years of education: Not on file   Highest education level: Some college, no degree  Occupational History   Not on file  Tobacco Use   Smoking status: Never   Smokeless tobacco: Never  Vaping Use   Vaping status: Never Used  Substance and Sexual Activity   Alcohol use: Yes    Alcohol/week: 2.0 - 3.0 standard drinks of alcohol    Types: 2 - 3 Standard drinks or equivalent per week   Drug use: No   Sexual activity: Yes    Birth control/protection: Pill  Other Topics Concern   Not on file  Social History Narrative   Not on file   Social Determinants of Health   Financial Resource Strain: Low Risk  (09/30/2022)   Overall Financial Resource Strain (CARDIA)    Difficulty of Paying Living Expenses: Not hard at all  Food Insecurity: No Food Insecurity (09/30/2022)   Hunger Vital Sign    Worried About Running Out of Food in the Last Year: Never true    Ran Out  of Food in the Last Year: Never true  Transportation Needs: No Transportation Needs (09/30/2022)   PRAPARE - Administrator, Civil Service (Medical): No    Lack of Transportation (Non-Medical): No  Physical Activity: Insufficiently Active (09/30/2022)   Exercise Vital Sign    Days of Exercise per Week: 4 days    Minutes of Exercise per Session: 20 min  Stress: No Stress Concern Present (09/30/2022)   [employer] of Occupational Health - Occupational Stress Questionnaire    Feeling of Stress : Not at all  Social Connections: Moderately Isolated (09/30/2022)   Social Connection and Isolation Panel [NHANES]    Frequency of Communication with Friends and Family: More than three times a week    Frequency of Social  Gatherings with Friends and Family: Twice a week    Attends Religious Services: More than 4 times per year    Active Member of [employer] or Organizations: No    Attends Engineer, structural: Not on file    Marital Status: Never married  Intimate Partner Violence: Not At Risk (08/05/2022)   Humiliation, Afraid, Rape, and Kick questionnaire    Fear of Current or Ex-Partner: No    Emotionally Abused: No    Physically Abused: No    Sexually Abused: No     Constitutional: Denies fever, malaise, fatigue, headache or abrupt weight changes.  HEENT: Denies eye pain, eye redness, ear pain, ringing in the ears, wax buildup, runny nose, nasal congestion, bloody nose, or sore throat. Respiratory: Denies difficulty breathing, shortness of breath, cough or sputum production.   Cardiovascular: Denies chest pain, chest tightness, palpitations or swelling in the hands or feet.  Skin: Patient reports scabbed lesion between her lip and her nose.  Denies  rashes or ulcercations.    No other specific complaints in a complete review of systems (except as listed in HPI above).    Observations/Objective:    Wt Readings from Last 3 Encounters:  10/04/22 179 lb (81.2 kg)  08/15/22 178 lb (80.7 kg)  08/06/22 183 lb (83 kg)    General: Appears her stated age, well developed, well nourished in NAD. Skin: 1 cm scabbed lesion noted above the left side of the lip. Pulmonary/Chest: Normal effort. No respiratory distress.    BMET    Component Value Date/Time   NA 136 10/04/2022 0820   NA 138 07/09/2016 1448   K 4.3 10/04/2022 0820   CL 100 10/04/2022 0820   CO2 26 10/04/2022 0820   GLUCOSE 95 10/04/2022 0820   BUN 10 10/04/2022 0820   BUN 8 03/04/2017 0000   CREATININE 1.06 (H) 10/04/2022 0820   CALCIUM 9.6 10/04/2022 0820   GFRNONAA 59 (L) 08/07/2022 0322   GFRNONAA 66 03/17/2020 1007   GFRAA 77 03/17/2020 1007    Lipid Panel     Component Value Date/Time   CHOL 226 (H) 10/04/2022 0820    CHOL 195 02/28/2016 0000   TRIG 155 (H) 10/04/2022 0820   TRIG 86 02/28/2016 0000   HDL 124 10/04/2022 0820   CHOLHDL 1.8 10/04/2022 0820   LDLCALC 77 10/04/2022 0820    CBC    Component Value Date/Time   WBC 7.9 10/04/2022 0820   RBC 4.29 10/04/2022 0820   HGB 11.6 (L) 10/04/2022 0820   HGB 11.5 05/31/2021 1628   HCT 36.5 10/04/2022 0820   HCT 35.1 05/31/2021 1628   PLT 539 (H) 10/04/2022 0820   PLT 491 (H) 05/31/2021 1628   MCV  85.1 10/04/2022 0820   MCV 82 05/31/2021 1628   MCH 27.0 10/04/2022 0820   MCHC 31.8 (L) 10/04/2022 0820   RDW 13.9 10/04/2022 0820   RDW 13.5 05/31/2021 1628   LYMPHSABS 1,853 03/17/2020 1007   LYMPHSABS 2.2 03/29/2019 1536   MONOABS 0.6 04/13/2018 1106   EOSABS 362 03/17/2020 1007   EOSABS 0.2 03/29/2019 1536   BASOSABS 57 03/17/2020 1007   BASOSABS 0.0 03/29/2019 1536    Hgb A1C Lab Results  Component Value Date   HGBA1C 5.0 03/22/2021        Assessment and Plan:  Skin lesion of face:  Unable to see urgent care notes or labs Per her report, viral swab was negative Appears to be healing, advised her I do not think that we need any more oral antibiotics Continue mupirocin 2% twice daily until fully resolved Update me if symptoms persist or worsen.  RTC in 2 months for your annual exam  Follow Up Instructions:    I discussed the assessment and treatment plan with the patient. The patient was provided an opportunity to ask questions and all were answered. The patient agreed with the plan and demonstrated an understanding of the instructions.   The patient was advised to call back or seek an in-person evaluation if the symptoms worsen or if the condition fails to improve as anticipated.    Nicki Reaper, NP

## 2023-02-18 ENCOUNTER — Other Ambulatory Visit: Payer: Self-pay | Admitting: Internal Medicine

## 2023-02-19 NOTE — Telephone Encounter (Signed)
Changed to a 90 day supply at request of pharmacy.  Requested Prescriptions  Pending Prescriptions Disp Refills   olmesartan-hydrochlorothiazide (BENICAR HCT) 20-12.5 MG tablet [Pharmacy Med Name: OLMESARTAN-HCTZ 20-12.5 MG TAB] 90 tablet 0    Sig: TAKE 1 TABLET BY MOUTH EVERY DAY     Cardiovascular: ARB + Diuretic Combos Failed - 02/18/2023  2:31 PM      Failed - Cr in normal range and within 180 days    Creat  Date Value Ref Range Status  10/04/2022 1.06 (H) 0.50 - 1.03 mg/dL Final         Passed - K in normal range and within 180 days    Potassium  Date Value Ref Range Status  10/04/2022 4.3 3.5 - 5.3 mmol/L Final         Passed - Na in normal range and within 180 days    Sodium  Date Value Ref Range Status  10/04/2022 136 135 - 146 mmol/L Final  07/09/2016 138 134 - 144 mmol/L Final         Passed - eGFR is 10 or above and within 180 days    GFR, Est African American  Date Value Ref Range Status  03/17/2020 77 > OR = 60 mL/min/1.88m2 Final   GFR, Est Non African American  Date Value Ref Range Status  03/17/2020 66 > OR = 60 mL/min/1.74m2 Final   GFR, Estimated  Date Value Ref Range Status  08/07/2022 59 (L) >60 mL/min Final    Comment:    (NOTE) Calculated using the CKD-EPI Creatinine Equation (2021)    eGFR  Date Value Ref Range Status  10/04/2022 64 > OR = 60 mL/min/1.74m2 Final         Passed - Patient is not pregnant      Passed - Last BP in normal range    BP Readings from Last 1 Encounters:  10/04/22 134/82         Passed - Valid encounter within last 6 months    Recent Outpatient Visits           3 weeks ago Skin lesion of face   [facility] [place], Salvadore Oxford, NP   4 months ago Primary hypertension   [facility] [place], Salvadore Oxford, NP   6 months ago Choledocholithiasis   [facility] [place], Salvadore Oxford, NP   11 months ago Encounter for general adult medical  examination with abnormal findings   [facility] [place], Salvadore Oxford, NP   1 year ago Encounter for general adult medical examination with abnormal findings   [facility] [place], Salvadore Oxford, NP       Future Appointments             In 1 month Baity, Salvadore Oxford, NP [facility], [facility]

## 2023-02-20 ENCOUNTER — Other Ambulatory Visit: Payer: Self-pay | Admitting: Internal Medicine

## 2023-02-20 DIAGNOSIS — Z3041 Encounter for surveillance of contraceptive pills: Secondary | ICD-10-CM

## 2023-02-20 NOTE — Telephone Encounter (Signed)
Requested Prescriptions  Pending Prescriptions Disp Refills   norethindrone-ethinyl estradiol 1/35 (ALYACEN 1/35) tablet [Pharmacy Med Name: ALYACEN 1-35 28 TABLET] 84 tablet 0    Sig: TAKE 1 TABLET BY MOUTH EVERY DAY     OB/GYN:  Contraceptives Passed - 02/20/2023  1:42 AM      Passed - Last BP in normal range    BP Readings from Last 1 Encounters:  10/04/22 134/82         Passed - Valid encounter within last 12 months    Recent Outpatient Visits           3 weeks ago Skin lesion of face   [place] [facility] [place], Salvadore Oxford, NP   4 months ago Primary hypertension   [facility] [place], Salvadore Oxford, NP   6 months ago Choledocholithiasis   [facility] [place], Salvadore Oxford, NP   11 months ago Encounter for general adult medical examination with abnormal findings   [facility] [place], Salvadore Oxford, NP   1 year ago Encounter for general adult medical examination with abnormal findings   [place] [facility] [place], Salvadore Oxford, NP       Future Appointments             In 1 month Baity, Salvadore Oxford, NP [facility], [facility]            Passed - Patient is not a smoker

## 2023-03-05 ENCOUNTER — Ambulatory Visit
Admission: RE | Admit: 2023-03-05 | Discharge: 2023-03-05 | Disposition: A | Discharge status: Home / Self Care | Payer: BC Managed Care – PPO | Source: Ambulatory Visit | Attending: Internal Medicine | Admitting: Internal Medicine

## 2023-03-05 DIAGNOSIS — Z1231 Encounter for screening mammogram for malignant neoplasm of breast: Secondary | ICD-10-CM | POA: Diagnosis not present

## 2023-03-25 ENCOUNTER — Ambulatory Visit (INDEPENDENT_AMBULATORY_CARE_PROVIDER_SITE_OTHER): Payer: BC Managed Care – PPO | Admitting: Internal Medicine

## 2023-03-25 ENCOUNTER — Encounter: Payer: Self-pay | Admitting: Internal Medicine

## 2023-03-25 VITALS — BP 128/84 | HR 76 | Temp 95.9°F | Ht 64.0 in | Wt 180.0 lb

## 2023-03-25 DIAGNOSIS — R739 Hyperglycemia, unspecified: Secondary | ICD-10-CM | POA: Diagnosis not present

## 2023-03-25 DIAGNOSIS — Z6830 Body mass index (BMI) 30.0-30.9, adult: Secondary | ICD-10-CM

## 2023-03-25 DIAGNOSIS — Z0001 Encounter for general adult medical examination with abnormal findings: Secondary | ICD-10-CM

## 2023-03-25 DIAGNOSIS — E66811 Obesity, class 1: Secondary | ICD-10-CM | POA: Diagnosis not present

## 2023-03-25 DIAGNOSIS — Z3041 Encounter for surveillance of contraceptive pills: Secondary | ICD-10-CM | POA: Diagnosis not present

## 2023-03-25 DIAGNOSIS — E6609 Other obesity due to excess calories: Secondary | ICD-10-CM

## 2023-03-25 DIAGNOSIS — Z23 Encounter for immunization: Secondary | ICD-10-CM | POA: Diagnosis not present

## 2023-03-25 MED ORDER — OLMESARTAN MEDOXOMIL-HCTZ 20-12.5 MG PO TABS: 1.0000 | ORAL_TABLET | Freq: Every day | ORAL | 1 refills | Status: DC

## 2023-03-25 MED ORDER — ALYACEN 1/35 1-35 MG-MCG PO TABS
1.0000 | ORAL_TABLET | Freq: Every day | ORAL | 1 refills | Status: DC
Start: 2023-03-25 — End: 2023-09-23

## 2023-03-25 NOTE — Patient Instructions (Signed)

## 2023-03-25 NOTE — Assessment & Plan Note (Signed)
Encouraged diet and exercise for weight loss ?

## 2023-03-25 NOTE — Progress Notes (Signed)
Subjective:    Patient ID: Madison Conway, female    DOB: 27-Jul-1970, 52 y.o.   MRN: [id number]  HPI  Patient presents to clinic today for annual exam.  Flu: 02/2021 Tetanus: 11/2015 COVID: X 3 Shingrix: Never Pap smear: 02/2022 Mammogram: 02/2023 Colon screening: 04/2021 Vision screening: every 2 years Dentist: biannually  Diet: She does eat some meat. She consumes more veggies than fruits. She tries to avoid fried foods. She drinks mostly water, soda. Exercise: Gym 4 x week   Review of Systems  Past Medical History:  Diagnosis Date   Asthma    childhood.   HTN (hypertension)    Thrombocytosis     Current Outpatient Medications  Medication Sig Dispense Refill   famotidine (PEPCID) 10 MG tablet Take 10 mg by mouth daily as needed for heartburn or indigestion.     ferrous sulfate (FER-IN-SOL) 75 (15 Fe) MG/ML SOLN Take 15 mg of iron by mouth 3 (three) times a week.      Multiple Vitamins-Minerals (MULTIVITAMIN ADULT PO) Take 1 tablet by mouth every other day.      norethindrone-ethinyl estradiol 1/35 (ALYACEN 1/35) tablet TAKE 1 TABLET BY MOUTH EVERY DAY 84 tablet 0   olmesartan-hydrochlorothiazide (BENICAR HCT) 20-12.5 MG tablet TAKE 1 TABLET BY MOUTH EVERY DAY 90 tablet 0   No current facility-administered medications for this visit.    No Known Allergies  Family History  Problem Relation Age of Onset   Diabetes Maternal Grandmother    Breast cancer Neg Hx    Colon cancer Neg Hx    Ovarian cancer Neg Hx     Social History   Socioeconomic History   Marital status: Single    Spouse name: Not on file   Number of children: Not on file   Years of education: Not on file   Highest education level: Some college, no degree  Occupational History   Not on file  Tobacco Use   Smoking status: Never   Smokeless tobacco: Never  Vaping Use   Vaping status: Never Used  Substance and Sexual Activity   Alcohol use: Yes    Alcohol/week: 2.0 - 3.0 standard drinks of  alcohol    Types: 2 - 3 Standard drinks or equivalent per week   Drug use: No   Sexual activity: Yes    Birth control/protection: Pill  Other Topics Concern   Not on file  Social History Narrative   Not on file   Social Determinants of Health   Financial Resource Strain: Low Risk  (09/30/2022)   Overall Financial Resource Strain (CARDIA)    Difficulty of Paying Living Expenses: Not hard at all  Food Insecurity: No Food Insecurity (09/30/2022)   Hunger Vital Sign    Worried About Running Out of Food in the Last Year: Never true    Ran Out of Food in the Last Year: Never true  Transportation Needs: No Transportation Needs (09/30/2022)   PRAPARE - Administrator, Civil Service (Medical): No    Lack of Transportation (Non-Medical): No  Physical Activity: Insufficiently Active (09/30/2022)   Exercise Vital Sign    Days of Exercise per Week: 4 days    Minutes of Exercise per Session: 20 min  Stress: No Stress Concern Present (09/30/2022)   [employer] of Occupational Health - Occupational Stress Questionnaire    Feeling of Stress : Not at all  Social Connections: Moderately Isolated (09/30/2022)   Social Connection and Isolation Panel [NHANES]  Frequency of Communication with Friends and Family: More than three times a week    Frequency of Social Gatherings with Friends and Family: Twice a week    Attends Religious Services: More than 4 times per year    Active Member of [employer] or Organizations: No    Attends Engineer, structural: Not on file    Marital Status: Never married  Intimate Partner Violence: Not At Risk (08/05/2022)   Humiliation, Afraid, Rape, and Kick questionnaire    Fear of Current or Ex-Partner: No    Emotionally Abused: No    Physically Abused: No    Sexually Abused: No     Constitutional: Denies fever, malaise, fatigue, headache or abrupt weight changes.  HEENT: Denies eye pain, eye redness, ear pain, ringing in the ears, wax buildup, runny  nose, nasal congestion, bloody nose, or sore throat. Respiratory: Denies difficulty breathing, shortness of breath, cough or sputum production.   Cardiovascular: Denies chest pain, chest tightness, palpitations or swelling in the hands or feet.  Gastrointestinal: Pt reports intermittent reflux. Denies abdominal pain, bloating, constipation, diarrhea or blood in the stool.  GU: Denies urgency, frequency, pain with urination, burning sensation, blood in urine, odor or discharge. Musculoskeletal: Denies decrease in range of motion, difficulty with gait, muscle pain or joint pain and swelling.  Skin: Denies redness, rashes, lesions or ulcercations.  Neurological: Denies dizziness, difficulty with memory, difficulty with speech or problems with balance and coordination.  Psych: Denies anxiety, depression, SI/HI.  No other specific complaints in a complete review of systems (except as listed in HPI above).     Objective:   Physical Exam BP 128/84 (BP Location: Left Arm, Patient Position: Sitting, Cuff Size: Normal)   Pulse 76   Temp (!) 95.9 F (35.5 C) (Temporal)   Ht 5\' 4"  (1.626 m)   Wt 180 lb (81.6 kg)   LMP 03/02/2023   SpO2 98%   BMI 30.90 kg/m   Wt Readings from Last 3 Encounters:  10/04/22 179 lb (81.2 kg)  08/15/22 178 lb (80.7 kg)  08/06/22 183 lb (83 kg)    General: Appears her stated age, obese, in NAD. Skin: Warm, dry and intact.  HEENT: Head: normal shape and size; Eyes: sclera white, no icterus, conjunctiva pink, PERRLA and EOMs intact;  Neck:  Neck supple, trachea midline. No masses, lumps or thyromegaly present.  Cardiovascular: Normal rate and rhythm. S1,S2 noted.  No murmur, rubs or gallops noted. No JVD or BLE edema. No carotid bruits noted. Pulmonary/Chest: Normal effort and positive vesicular breath sounds. No respiratory distress. No wheezes, rales or ronchi noted.  Abdomen: Soft and nontender. Normal bowel sounds.  Musculoskeletal: Strength 5/5 BUE/BLE.  No  difficulty with gait.  Neurological: Alert and oriented. Cranial nerves II-XII grossly intact. Coordination normal.  Psychiatric: Mood and affect normal. Behavior is normal. Judgment and thought content normal.     BMET    Component Value Date/Time   NA 136 10/04/2022 0820   NA 138 07/09/2016 1448   K 4.3 10/04/2022 0820   CL 100 10/04/2022 0820   CO2 26 10/04/2022 0820   GLUCOSE 95 10/04/2022 0820   BUN 10 10/04/2022 0820   BUN 8 03/04/2017 0000   CREATININE 1.06 (H) 10/04/2022 0820   CALCIUM 9.6 10/04/2022 0820   GFRNONAA 59 (L) 08/07/2022 0322   GFRNONAA 66 03/17/2020 1007   GFRAA 77 03/17/2020 1007    Lipid Panel     Component Value Date/Time   CHOL 226 (  H) 10/04/2022 0820   CHOL 195 02/28/2016 0000   TRIG 155 (H) 10/04/2022 0820   TRIG 86 02/28/2016 0000   HDL 124 10/04/2022 0820   CHOLHDL 1.8 10/04/2022 0820   LDLCALC 77 10/04/2022 0820    CBC    Component Value Date/Time   WBC 7.9 10/04/2022 0820   RBC 4.29 10/04/2022 0820   HGB 11.6 (L) 10/04/2022 0820   HGB 11.5 05/31/2021 1628   HCT 36.5 10/04/2022 0820   HCT 35.1 05/31/2021 1628   PLT 539 (H) 10/04/2022 0820   PLT 491 (H) 05/31/2021 1628   MCV 85.1 10/04/2022 0820   MCV 82 05/31/2021 1628   MCH 27.0 10/04/2022 0820   MCHC 31.8 (L) 10/04/2022 0820   RDW 13.9 10/04/2022 0820   RDW 13.5 05/31/2021 1628   LYMPHSABS 1,853 03/17/2020 1007   LYMPHSABS 2.2 03/29/2019 1536   MONOABS 0.6 04/13/2018 1106   EOSABS 362 03/17/2020 1007   EOSABS 0.2 03/29/2019 1536   BASOSABS 57 03/17/2020 1007   BASOSABS 0.0 03/29/2019 1536    Hgb A1C Lab Results  Component Value Date   HGBA1C 5.0 03/22/2021             Assessment & Plan:   Preventative health maintenance:  Flu shot today Tetanus UTD Encouraged her to get her COVID booster Discussed Shingrix vaccine, she will check coverage with her insurance company and schedule visit if she would like to have this done Pap smear UTD Mammogram  UTD Colon screening UTD Encouraged her to consume a balanced diet and exercise regimen Advised her to see an eye doctor and dentist annually We will check CBC, c-Met, lipid, A1c today  RTC in 6 months, follow-up chronic conditions Nicki Reaper, NP

## 2023-03-26 ENCOUNTER — Encounter: Payer: Self-pay | Admitting: Internal Medicine

## 2023-03-26 LAB — COMPLETE METABOLIC PANEL WITH GFR
AG Ratio: 1.3 (calc) (ref 1.0–2.5)
ALT: 17 U/L (ref 6–29)
AST: 18 U/L (ref 10–35)
Albumin: 4 g/dL (ref 3.6–5.1)
Alkaline phosphatase (APISO): 54 U/L (ref 37–153)
BUN/Creatinine Ratio: 8 (calc) (ref 6–22)
BUN: 9 mg/dL (ref 7–25)
CO2: 26 mmol/L (ref 20–32)
Calcium: 9.1 mg/dL (ref 8.6–10.4)
Chloride: 100 mmol/L (ref 98–110)
Creat: 1.08 mg/dL — ABNORMAL HIGH (ref 0.50–1.03)
Globulin: 3.2 g/dL (ref 1.9–3.7)
Glucose, Bld: 92 mg/dL (ref 65–99)
Potassium: 3.9 mmol/L (ref 3.5–5.3)
Sodium: 136 mmol/L (ref 135–146)
Total Bilirubin: 1.1 mg/dL (ref 0.2–1.2)
Total Protein: 7.2 g/dL (ref 6.1–8.1)
eGFR: 63 mL/min/{1.73_m2} (ref >=60)

## 2023-03-26 LAB — CBC
HCT: 36.4 % (ref 35.0–45.0)
Hemoglobin: 11.5 g/dL — ABNORMAL LOW (ref 11.7–15.5)
MCH: 27.8 pg (ref 27.0–33.0)
MCHC: 31.6 g/dL — ABNORMAL LOW (ref 32.0–36.0)
MCV: 88.1 fL (ref 80.0–100.0)
MPV: 8.4 fL (ref 7.5–12.5)
Platelets: 457 10*3/uL — ABNORMAL HIGH (ref 140–400)
RBC: 4.13 10*6/uL (ref 3.80–5.10)
RDW: 13.9 % (ref 11.0–15.0)
WBC: 7.9 10*3/uL (ref 3.8–10.8)

## 2023-03-26 LAB — HEMOGLOBIN A1C
Hgb A1c MFr Bld: 5.2 %{Hb} (ref <5.7)
Mean Plasma Glucose: 103 mg/dL
eAG (mmol/L): 5.7 mmol/L

## 2023-03-26 LAB — LIPID PANEL
Cholesterol: 226 mg/dL — ABNORMAL HIGH (ref <200)
HDL: 121 mg/dL (ref >=50)
LDL Cholesterol (Calc): 81 mg/dL
Non-HDL Cholesterol (Calc): 105 mg/dL (ref <130)
Total CHOL/HDL Ratio: 1.9 (calc) (ref <5.0)
Triglycerides: 139 mg/dL (ref <150)

## 2023-04-29 DIAGNOSIS — H524 Presbyopia: Secondary | ICD-10-CM | POA: Diagnosis not present

## 2023-06-17 ENCOUNTER — Encounter: Payer: Self-pay | Admitting: Internal Medicine

## 2023-09-23 ENCOUNTER — Ambulatory Visit: Payer: BC Managed Care – PPO | Admitting: Internal Medicine

## 2023-09-23 ENCOUNTER — Encounter: Payer: Self-pay | Admitting: Internal Medicine

## 2023-09-23 VITALS — BP 124/82 | Ht 64.0 in | Wt 179.0 lb

## 2023-09-23 DIAGNOSIS — E6609 Other obesity due to excess calories: Secondary | ICD-10-CM

## 2023-09-23 DIAGNOSIS — E66811 Other obesity due to excess calories: Secondary | ICD-10-CM

## 2023-09-23 DIAGNOSIS — N182 Chronic kidney disease, stage 2 (mild): Secondary | ICD-10-CM

## 2023-09-23 DIAGNOSIS — K219 Gastro-esophageal reflux disease without esophagitis: Secondary | ICD-10-CM

## 2023-09-23 DIAGNOSIS — Z136 Encounter for screening for cardiovascular disorders: Secondary | ICD-10-CM | POA: Diagnosis not present

## 2023-09-23 DIAGNOSIS — D508 Other iron deficiency anemias: Secondary | ICD-10-CM | POA: Diagnosis not present

## 2023-09-23 DIAGNOSIS — R739 Hyperglycemia, unspecified: Secondary | ICD-10-CM | POA: Diagnosis not present

## 2023-09-23 DIAGNOSIS — Z6830 Body mass index (BMI) 30.0-30.9, adult: Secondary | ICD-10-CM

## 2023-09-23 DIAGNOSIS — I10 Essential (primary) hypertension: Secondary | ICD-10-CM

## 2023-09-23 DIAGNOSIS — Z3041 Encounter for surveillance of contraceptive pills: Secondary | ICD-10-CM

## 2023-09-23 MED ORDER — ALYACEN 1/35 1-35 MG-MCG PO TABS: 1.0000 | ORAL_TABLET | Freq: Every day | ORAL | 1 refills | Status: DC

## 2023-09-23 MED ORDER — OLMESARTAN MEDOXOMIL-HCTZ 20-12.5 MG PO TABS: 1.0000 | ORAL_TABLET | Freq: Every day | ORAL | 1 refills | Status: DC

## 2023-09-23 NOTE — Patient Instructions (Addendum)
 Right Iron-Rich Diet  Iron is a mineral that helps your body produce hemoglobin. Hemoglobin is a protein in red blood cells that carries oxygen to your body's tissues. Eating too little iron may cause you to feel weak and tired, and it can increase your risk of infection. Iron is naturally found in many foods, and many foods have iron added to them (are iron-fortified). You may need to follow an iron-rich diet if you do not have enough iron in your body due to certain medical conditions. The amount of iron that you need each day depends on your age, your sex, and any medical conditions you have. Follow instructions from your health care provider or a dietitian about how much iron you should eat each day. What are tips for following this plan? Reading food labels Check food labels to see how many milligrams (mg) of iron are in each serving. Cooking Cook foods in pots and pans that are made from iron. Take these steps to make it easier for your body to absorb iron from certain foods: Soak beans overnight before cooking. Soak whole grains overnight and drain them before using. Ferment flours before baking, such as by using yeast in bread dough. Meal planning When you eat foods that contain iron, you should eat them with foods that are high in vitamin C. These include oranges, peppers, tomatoes, potatoes, and mangoes. Vitamin C helps your body absorb iron. Certain foods and drinks prevent your body from absorbing iron properly. Avoid eating these foods in the same meal as iron-rich foods or with iron supplements. These foods include: Coffee, black tea, and red wine. Milk, dairy products, and foods that are high in calcium. Beans and soybeans. Whole grains. General information Take iron supplements only as told by your health care provider. An overdose of iron can be life-threatening. If you were prescribed iron supplements, take them with orange juice or a vitamin C supplement. When you eat  iron-fortified foods or take an iron supplement, you should also eat foods that naturally contain iron, such as meat, poultry, and fish. Eating naturally iron-rich foods helps your body absorb the iron that is added to other foods or contained in a supplement. Iron from animal sources is better absorbed than iron from plant sources. What foods should I eat? Fruits Prunes. Raisins. Eat fruits high in vitamin C, such as oranges, grapefruits, and strawberries, with iron-rich foods. Vegetables Spinach (cooked). Green peas. Broccoli. Fermented vegetables. Eat vegetables high in vitamin C, such as leafy greens, potatoes, bell peppers, and tomatoes, with iron-rich foods. Grains Iron-fortified breakfast cereal. Iron-fortified whole-wheat bread. Enriched rice. Sprouted grains. Meats and other proteins Beef liver. Beef. [place]. Chicken. Oysters. Shrimp. Tuna. Sardines. Chickpeas. Nuts. Tofu. Pumpkin seeds. Beverages Tomato juice. Fresh orange juice. Prune juice. Hibiscus tea. Iron-fortified instant breakfast shakes. Sweets and desserts Blackstrap molasses. Seasonings and condiments Tahini. Fermented soy sauce. Other foods Wheat germ. The items listed above may not be a complete list of recommended foods and beverages. Contact a dietitian for more information. What foods should I limit? These are foods that should be limited while eating iron-rich foods as they can reduce the absorption of iron in your body. Grains Whole grains. Bran cereal. Bran flour. Meats and other proteins Soybeans. Products made from soy protein. Black beans. Lentils. Mung beans. Split peas. Dairy Milk. Cream. Cheese. Yogurt. Cottage cheese. Beverages Coffee. Black tea. Red wine. Sweets and desserts Cocoa. Chocolate. Ice cream. Seasonings and condiments Basil. Oregano. Large amounts of parsley. The items  listed above may not be a complete list of foods and beverages you should limit. Contact a dietitian for more  information. Summary Iron is a mineral that helps your body produce hemoglobin. Hemoglobin is a protein in red blood cells that carries oxygen to your body's tissues. Iron is naturally found in many foods, and many foods have iron added to them (are iron-fortified). When you eat foods that contain iron, you should eat them with foods that are high in vitamin C. Vitamin C helps your body absorb iron. Certain foods and drinks prevent your body from absorbing iron properly, such as whole grains and dairy products. You should avoid eating these foods in the same meal as iron-rich foods or with iron supplements. This information is not intended to replace advice given to you by your health care provider. Make sure you discuss any questions you have with your health care provider. Document Revised: 05/22/2020 Document Reviewed: 05/22/2020 Elsevier Patient Education  2024 [employer].

## 2023-09-23 NOTE — Assessment & Plan Note (Signed)
Controlled on olmesartan HCT Reinforced DASH diet and exercise for weight loss C-Met today 

## 2023-09-23 NOTE — Assessment & Plan Note (Signed)
Will repeat c-Met today Encouraged adequate water intake Avoid anti-inflammatories OTC

## 2023-09-23 NOTE — Progress Notes (Signed)
 Subjective:    Patient ID: Madison Conway, female    DOB: May 17, 1971, 53 y.o.   MRN: [id number]  HPI  Patient presents to the clinic today for 70-month follow-up of chronic conditions.  HTN: Her BP today is 124/82.  She is taking olmesartan HCT as prescribed.  ECG from 07/2022 reviewed.  Iron deficiency anemia: Her last H/H was 11.5/36.4, 03/2023.  She is taking some oral iron OTC also but tries to manage this with diet.  She does not follow with hematology.  GERD: Triggered by acidic foods.  She takes famotidine as needed.  There is no upper GI on file.  CKD 2: Her last creatinine was 1.08, GFR 63, 03/2023.  She is on olmesartan for renal protection.  She does not follow with nephrology.  Review of Systems     Past Medical History:  Diagnosis Date   Asthma    childhood.   HTN (hypertension)    Thrombocytosis     Current Outpatient Medications  Medication Sig Dispense Refill   famotidine (PEPCID) 10 MG tablet Take 10 mg by mouth daily as needed for heartburn or indigestion.     ferrous sulfate (FER-IN-SOL) 75 (15 Fe) MG/ML SOLN Take 15 mg of iron by mouth 3 (three) times a week.      Multiple Vitamins-Minerals (MULTIVITAMIN ADULT PO) Take 1 tablet by mouth every other day.      norethindrone-ethinyl estradiol 1/35 (ALYACEN 1/35) tablet Take 1 tablet by mouth daily. 84 tablet 1   olmesartan-hydrochlorothiazide (BENICAR HCT) 20-12.5 MG tablet Take 1 tablet by mouth daily. 90 tablet 1   No current facility-administered medications for this visit.    No Known Allergies  Family History  Problem Relation Age of Onset   Diabetes Maternal Grandmother    Breast cancer Neg Hx    Colon cancer Neg Hx    Ovarian cancer Neg Hx     Social History   Socioeconomic History   Marital status: Single    Spouse name: Not on file   Number of children: Not on file   Years of education: Not on file   Highest education level: Some college, no degree  Occupational History   Not on  file  Tobacco Use   Smoking status: Never   Smokeless tobacco: Never  Vaping Use   Vaping status: Never Used  Substance and Sexual Activity   Alcohol use: Yes    Alcohol/week: 2.0 - 3.0 standard drinks of alcohol    Types: 2 - 3 Standard drinks or equivalent per week   Drug use: No   Sexual activity: Yes    Birth control/protection: Pill  Other Topics Concern   Not on file  Social History Narrative   Not on file   Social Drivers of Health   Financial Resource Strain: Low Risk  (09/19/2023)   Overall Financial Resource Strain (CARDIA)    Difficulty of Paying Living Expenses: Not hard at all  Food Insecurity: No Food Insecurity (09/19/2023)   Hunger Vital Sign    Worried About Running Out of Food in the Last Year: Never true    Ran Out of Food in the Last Year: Never true  Transportation Needs: No Transportation Needs (09/19/2023)   PRAPARE - Administrator, Civil Service (Medical): No    Lack of Transportation (Non-Medical): No  Physical Activity: Insufficiently Active (09/19/2023)   Exercise Vital Sign    Days of Exercise per Week: 4 days    Minutes of  Exercise per Session: 30 min  Stress: No Stress Concern Present (09/19/2023)   [employer] of Occupational Health - Occupational Stress Questionnaire    Feeling of Stress : Not at all  Social Connections: Socially Integrated (09/19/2023)   Social Connection and Isolation Panel [NHANES]    Frequency of Communication with Friends and Family: Three times a week    Frequency of Social Gatherings with Friends and Family: Twice a week    Attends Religious Services: More than 4 times per year    Active Member of [employer] or Organizations: Yes    Attends Engineer, structural: More than 4 times per year    Marital Status: Living with partner  Intimate Partner Violence: Not At Risk (08/05/2022)   Humiliation, Afraid, Rape, and Kick questionnaire    Fear of Current or Ex-Partner: No    Emotionally Abused: No     Physically Abused: No    Sexually Abused: No     Constitutional: Denies fever, malaise, fatigue, headache or abrupt weight changes.  HEENT: Denies eye pain, eye redness, ear pain, ringing in the ears, wax buildup, runny nose, nasal congestion, bloody nose, or sore throat. Respiratory: Denies difficulty breathing, shortness of breath, cough or sputum production.   Cardiovascular: Denies chest pain, chest tightness, palpitations or swelling in the hands or feet.  Gastrointestinal: Denies abdominal pain, bloating, constipation, diarrhea or blood in the stool.  GU: Denies urgency, frequency, pain with urination, burning sensation, blood in urine, odor or discharge. Musculoskeletal: Denies decrease in range of motion, difficulty with gait, muscle pain or joint pain and swelling.  Skin: Denies redness, rashes, lesions or ulcercations.  Neurological: Denies dizziness, difficulty with memory, difficulty with speech or problems with balance and coordination.  Psych: Denies anxiety, depression, SI/HI.  No other specific complaints in a complete review of systems (except as listed in HPI above).  Objective:   Physical Exam  BP 124/82 (BP Location: Left Arm, Patient Position: Sitting, Cuff Size: Normal)   Ht 5\' 4"  (1.626 m)   Wt 179 lb (81.2 kg)   LMP 09/09/2023 (Approximate)   BMI 30.73 kg/m    Wt Readings from Last 3 Encounters:  03/25/23 180 lb (81.6 kg)  10/04/22 179 lb (81.2 kg)  08/15/22 178 lb (80.7 kg)    General: Appears her stated age, obese in NAD. Skin: Warm, dry and intact.  HEENT: Head: normal shape and size; Eyes: sclera white, no icterus, conjunctiva pink, PERRLA and EOMs intact;  Cardiovascular: Normal rate and rhythm. S1,S2 noted.  No murmur, rubs or gallops noted. No JVD or BLE edema. Pulmonary/Chest: Normal effort and positive vesicular breath sounds. No respiratory distress. No wheezes, rales or ronchi noted.  Abdomen: Soft and nontender.  Musculoskeletal:  No  difficulty with gait.  Neurological: Alert and oriented. Coordination normal.     BMET    Component Value Date/Time   NA 136 03/25/2023 0848   NA 138 07/09/2016 1448   K 3.9 03/25/2023 0848   CL 100 03/25/2023 0848   CO2 26 03/25/2023 0848   GLUCOSE 92 03/25/2023 0848   BUN 9 03/25/2023 0848   BUN 8 03/04/2017 0000   CREATININE 1.08 (H) 03/25/2023 0848   CALCIUM 9.1 03/25/2023 0848   GFRNONAA 59 (L) 08/07/2022 0322   GFRNONAA 66 03/17/2020 1007   GFRAA 77 03/17/2020 1007    Lipid Panel     Component Value Date/Time   CHOL 226 (H) 03/25/2023 0848   CHOL 195 02/28/2016 0000  TRIG 139 03/25/2023 0848   TRIG 86 02/28/2016 0000   HDL 121 03/25/2023 0848   CHOLHDL 1.9 03/25/2023 0848   LDLCALC 81 03/25/2023 0848    CBC    Component Value Date/Time   WBC 7.9 03/25/2023 0848   RBC 4.13 03/25/2023 0848   HGB 11.5 (L) 03/25/2023 0848   HGB 11.5 05/31/2021 1628   HCT 36.4 03/25/2023 0848   HCT 35.1 05/31/2021 1628   PLT 457 (H) 03/25/2023 0848   PLT 491 (H) 05/31/2021 1628   MCV 88.1 03/25/2023 0848   MCV 82 05/31/2021 1628   MCH 27.8 03/25/2023 0848   MCHC 31.6 (L) 03/25/2023 0848   RDW 13.9 03/25/2023 0848   RDW 13.5 05/31/2021 1628   LYMPHSABS 1,853 03/17/2020 1007   LYMPHSABS 2.2 03/29/2019 1536   MONOABS 0.6 04/13/2018 1106   EOSABS 362 03/17/2020 1007   EOSABS 0.2 03/29/2019 1536   BASOSABS 57 03/17/2020 1007   BASOSABS 0.0 03/29/2019 1536    Hgb A1C Lab Results  Component Value Date   HGBA1C 5.2 03/25/2023          Assessment & Plan:     RTC in 6 months for your annual exam Nicki Reaper, NP

## 2023-09-23 NOTE — Assessment & Plan Note (Signed)
Avoid foods that trigger reflux Encouraged weight loss as this can help reduce reflux symptoms Okay to continue famotidine as needed

## 2023-09-23 NOTE — Assessment & Plan Note (Signed)
 Encouraged diet and exercise for weight loss ?

## 2023-09-23 NOTE — Assessment & Plan Note (Signed)
 CBC today Continue oral iron and increased iron rich foods

## 2023-09-24 ENCOUNTER — Encounter: Payer: Self-pay | Admitting: Internal Medicine

## 2023-09-24 LAB — LIPID PANEL
Cholesterol: 221 mg/dL — ABNORMAL HIGH (ref <200)
HDL: 142 mg/dL (ref >=50)
LDL Cholesterol (Calc): 63 mg/dL
Non-HDL Cholesterol (Calc): 79 mg/dL (ref <130)
Total CHOL/HDL Ratio: 1.6 (calc) (ref <5.0)
Triglycerides: 78 mg/dL (ref <150)

## 2023-09-24 LAB — HEMOGLOBIN A1C
Hgb A1c MFr Bld: 5.3 %{Hb} (ref <5.7)
Mean Plasma Glucose: 105 mg/dL
eAG (mmol/L): 5.8 mmol/L

## 2023-09-24 LAB — COMPREHENSIVE METABOLIC PANEL WITH GFR
AG Ratio: 1.3 (calc) (ref 1.0–2.5)
ALT: 20 U/L (ref 6–29)
AST: 22 U/L (ref 10–35)
Albumin: 4 g/dL (ref 3.6–5.1)
Alkaline phosphatase (APISO): 56 U/L (ref 37–153)
BUN: 8 mg/dL (ref 7–25)
CO2: 27 mmol/L (ref 20–32)
Calcium: 9.2 mg/dL (ref 8.6–10.4)
Chloride: 100 mmol/L (ref 98–110)
Creat: 1.01 mg/dL (ref 0.50–1.03)
Globulin: 3.2 g/dL (ref 1.9–3.7)
Glucose, Bld: 87 mg/dL (ref 65–99)
Potassium: 4.1 mmol/L (ref 3.5–5.3)
Sodium: 136 mmol/L (ref 135–146)
Total Bilirubin: 1 mg/dL (ref 0.2–1.2)
Total Protein: 7.2 g/dL (ref 6.1–8.1)
eGFR: 67 mL/min/{1.73_m2} (ref >=60)

## 2023-09-24 LAB — CBC
HCT: 35.2 % (ref 35.0–45.0)
Hemoglobin: 11.5 g/dL — ABNORMAL LOW (ref 11.7–15.5)
MCH: 28 pg (ref 27.0–33.0)
MCHC: 32.7 g/dL (ref 32.0–36.0)
MCV: 85.9 fL (ref 80.0–100.0)
MPV: 8.3 fL (ref 7.5–12.5)
Platelets: 378 10*3/uL (ref 140–400)
RBC: 4.1 10*6/uL (ref 3.80–5.10)
RDW: 13.9 % (ref 11.0–15.0)
WBC: 9.8 10*3/uL (ref 3.8–10.8)

## 2024-01-20 ENCOUNTER — Other Ambulatory Visit: Payer: Self-pay | Admitting: Internal Medicine

## 2024-01-20 DIAGNOSIS — Z1231 Encounter for screening mammogram for malignant neoplasm of breast: Secondary | ICD-10-CM

## 2024-03-01 ENCOUNTER — Encounter: Payer: Self-pay | Admitting: Internal Medicine

## 2024-03-01 NOTE — Telephone Encounter (Signed)
Yes, she will need an appointment to discuss

## 2024-03-02 ENCOUNTER — Ambulatory Visit: Admitting: Internal Medicine

## 2024-03-02 ENCOUNTER — Encounter: Payer: Self-pay | Admitting: Internal Medicine

## 2024-03-02 VITALS — BP 124/80 | Ht 64.0 in | Wt 180.0 lb

## 2024-03-02 DIAGNOSIS — B354 Tinea corporis: Secondary | ICD-10-CM | POA: Diagnosis not present

## 2024-03-02 MED ORDER — CLOTRIMAZOLE-BETAMETHASONE 1-0.05 % EX CREA: 1.0000 | TOPICAL_CREAM | Freq: Two times a day (BID) | CUTANEOUS | 0 refills | Status: DC

## 2024-03-02 NOTE — Progress Notes (Signed)
 Subjective:    Patient ID: Madison Conway, female    DOB: [birth year], 53 y.o.   MRN: [id number]  HPI  Discussed the use of AI scribe software for clinical note transcription with the patient, who gave verbal consent to proceed.  Madison Conway is a 53 year old female who presents with a persistent rash on her stomach.  She has had a rash on her stomach for about three weeks. The rash is non-itchy and has increased in size since she began treatment. Initially, the rash was smaller, but it grew because she delayed treatment with Lotrimin  due to not having it on hand.  She has been using Lotrimin  inconsistently for about one to two weeks, as it rubs off due to her clothing. She recalls a similar rash years ago in the same area, which resolved with Lotrimin  within two weeks.  She initially thought the rash was ringworm. She associates the rash with wearing a waist trainer during workouts, which she does not wash frequently, and suspects it might be related to sweat accumulation.  No itching or burning is associated with the rash. It is only noticeable when she looks at it in the morning. She has also applied cocoa butter to the area to address any scarring.       Review of Systems     Past Medical History:  Diagnosis Date   Anemia    Asthma    childhood.   GERD (gastroesophageal reflux disease)    not a serious class, not even once a month   HTN (hypertension)    Thrombocytosis     Current Outpatient Medications  Medication Sig Dispense Refill   clotrimazole -betamethasone  (LOTRISONE ) cream Apply 1 Application topically 2 (two) times daily. 60 g 0   ferrous sulfate  (FER-IN-SOL) 75 (15 Fe) MG/ML SOLN Take 15 mg of iron by mouth 3 (three) times a week.      Multiple Vitamins-Minerals (MULTIVITAMIN ADULT PO) Take 1 tablet by mouth every other day.      norethindrone -ethinyl estradiol  1/35 (ALYACEN  1/35) tablet Take 1 tablet by mouth daily. 84 tablet 1    olmesartan -hydrochlorothiazide  (BENICAR  HCT) 20-12.5 MG tablet Take 1 tablet by mouth daily. 90 tablet 1   famotidine  (PEPCID ) 10 MG tablet Take 10 mg by mouth daily as needed for heartburn or indigestion. (Patient not taking: Reported on 03/02/2024)     No current facility-administered medications for this visit.    No Known Allergies  Family History  Problem Relation Age of Onset   Hypertension Mother    Diabetes Maternal Grandmother    Hypertension Sister    Breast cancer Neg Hx    Colon cancer Neg Hx    Ovarian cancer Neg Hx     Social History   Socioeconomic History   Marital status: Single    Spouse name: Not on file   Number of children: Not on file   Years of education: Not on file   Highest education level: Some college, no degree  Occupational History   Not on file  Tobacco Use   Smoking status: Never   Smokeless tobacco: Never  Vaping Use   Vaping status: Never Used  Substance and Sexual Activity   Alcohol use: Yes    Alcohol/week: 2.0 - 3.0 standard drinks of alcohol    Types: 2 - 3 Standard drinks or equivalent per week   Drug use: No   Sexual activity: Yes    Birth control/protection: Pill  Other Topics Concern  Not on file  Social History Narrative   Not on file   Social Drivers of Health   Financial Resource Strain: Low Risk  (09/19/2023)   Overall Financial Resource Strain (CARDIA)    Difficulty of Paying Living Expenses: Not hard at all  Food Insecurity: No Food Insecurity (09/19/2023)   Hunger Vital Sign    Worried About Running Out of Food in the Last Year: Never true    Ran Out of Food in the Last Year: Never true  Transportation Needs: No Transportation Needs (09/19/2023)   PRAPARE - Administrator, Civil Service (Medical): No    Lack of Transportation (Non-Medical): No  Physical Activity: Insufficiently Active (09/19/2023)   Exercise Vital Sign    Days of Exercise per Week: 4 days    Minutes of Exercise per Session: 30 min   Stress: No Stress Concern Present (09/19/2023)   [employer] of Occupational Health - Occupational Stress Questionnaire    Feeling of Stress : Not at all  Social Connections: Socially Integrated (09/19/2023)   Social Connection and Isolation Panel    Frequency of Communication with Friends and Family: Three times a week    Frequency of Social Gatherings with Friends and Family: Twice a week    Attends Religious Services: More than 4 times per year    Active Member of [employer] or Organizations: Yes    Attends Engineer, structural: More than 4 times per year    Marital Status: Living with partner  Intimate Partner Violence: Not At Risk (08/05/2022)   Humiliation, Afraid, Rape, and Kick questionnaire    Fear of Current or Ex-Partner: No    Emotionally Abused: No    Physically Abused: No    Sexually Abused: No     Constitutional: Denies fever, malaise, fatigue, headache or abrupt weight changes.  Cardiovascular: Denies chest pain, chest tightness, palpitations or swelling in the hands or feet.  Skin: Patient reports skin lesion of abdomen.  Denies ulcercations.    No other specific complaints in a complete review of systems (except as listed in HPI above).  Objective:   Physical Exam  BP 124/80 (BP Location: Left Arm, Patient Position: Sitting, Cuff Size: Normal)   Ht 5' 4 (1.626 m)   Wt 180 lb (81.6 kg)   LMP 01/31/2024 (Approximate)   BMI 30.90 kg/m    Wt Readings from Last 3 Encounters:  03/02/24 180 lb (81.6 kg)  09/23/23 179 lb (81.2 kg)  03/25/23 180 lb (81.6 kg)    General: Appears her stated age, obese in NAD. Skin: Warm, dry and intact.  2.5 cm oval, raised, scaly lesion noted on the left abdomen. Cardiovascular: Normal rate and rhythm.  Pulmonary/Chest: Normal effort. No respiratory distress.  Musculoskeletal:  No difficulty with gait.  Neurological: Alert and oriented.    BMET    Component Value Date/Time   NA 136 09/23/2023 0859   NA 138  07/09/2016 1448   K 4.1 09/23/2023 0859   CL 100 09/23/2023 0859   CO2 27 09/23/2023 0859   GLUCOSE 87 09/23/2023 0859   BUN 8 09/23/2023 0859   BUN 8 03/04/2017 0000   CREATININE 1.01 09/23/2023 0859   CALCIUM 9.2 09/23/2023 0859   GFRNONAA 59 (L) 08/07/2022 0322   GFRNONAA 66 03/17/2020 1007   GFRAA 77 03/17/2020 1007    Lipid Panel     Component Value Date/Time   CHOL 221 (H) 09/23/2023 0859   CHOL 195 02/28/2016 0000  TRIG 78 09/23/2023 0859   TRIG 86 02/28/2016 0000   HDL 142 09/23/2023 0859   CHOLHDL 1.6 09/23/2023 0859   LDLCALC 63 09/23/2023 0859    CBC    Component Value Date/Time   WBC 9.8 09/23/2023 0859   RBC 4.10 09/23/2023 0859   HGB 11.5 (L) 09/23/2023 0859   HGB 11.5 05/31/2021 1628   HCT 35.2 09/23/2023 0859   HCT 35.1 05/31/2021 1628   PLT 378 09/23/2023 0859   PLT 491 (H) 05/31/2021 1628   MCV 85.9 09/23/2023 0859   MCV 82 05/31/2021 1628   MCH 28.0 09/23/2023 0859   MCHC 32.7 09/23/2023 0859   RDW 13.9 09/23/2023 0859   RDW 13.5 05/31/2021 1628   LYMPHSABS 1,853 03/17/2020 1007   LYMPHSABS 2.2 03/29/2019 1536   MONOABS 0.6 04/13/2018 1106   EOSABS 362 03/17/2020 1007   EOSABS 0.2 03/29/2019 1536   BASOSABS 57 03/17/2020 1007   BASOSABS 0.0 03/29/2019 1536    Hgb A1C Lab Results  Component Value Date   HGBA1C 5.3 09/23/2023          Assessment & Plan:   Assessment and Plan    Tinea corporis Scaly rash for three weeks, non-itchy, stable, possibly fungal. Inconsistent Lotrimin  use. Possible association with waist trainer and sweating. - Prescribed Lotrisone  cream, apply once daily. - Advised covering rash with bandage to prevent cream from rubbing off. - Instructed consistent application of cream. - If no improvement, send message or picture via MyChart for further evaluation.       RTC in 1 months for your annual exam Angeline Laura, NP

## 2024-03-02 NOTE — Patient Instructions (Signed)
 Body Ringworm  Body ringworm is an infection of the skin that often causes a ring-shaped rash. Body ringworm is also called tinea corporis.  Body ringworm can affect any part of your skin. This condition is easily spread from person to person (is very contagious).  What are the causes?  This condition is caused by fungi called dermatophytes. The condition develops when these fungi grow out of control on the skin.  You can get this condition if you touch a person or animal that has it. You can also get it if you share any items with an infected person or pet. These include:  Clothing, bedding, and towels.  Brushes or combs.  Gym equipment.  Any other object that has the fungus on it.  What increases the risk?  You are more likely to develop this condition if you:  Play sports that involve close physical contact, such as wrestling.  Sweat a lot.  Live in areas that are hot and humid.  Use public showers.  Have a weakened disease-fighting system (immune system).  What are the signs or symptoms?    Symptoms of this condition include:  Itchy, raised red spots and bumps.  Red scaly patches.  A ring-shaped rash. The rash may have:  A clear center.  Scales or red bumps at its center.  Redness near its borders.  Dry and scaly skin on or around it.  How is this diagnosed?  This condition can usually be diagnosed with a skin exam. A skin scraping may be taken from the affected area and examined under a microscope to see if the fungus is present.  How is this treated?  This condition may be treated with:  An antifungal cream or ointment.  An antifungal shampoo.  Antifungal medicines. These may be prescribed if your ringworm:  Is severe.  Keeps coming back or lasts a long time.  Follow these instructions at home:  Take over-the-counter and prescription medicines only as told by your health care provider.  If you were given an antifungal cream or ointment:  Use it as told by your health care provider.  Wash the infected area and  dry it completely before applying the cream or ointment.  If you were given an antifungal shampoo:  Use it as told by your health care provider.  Leave the shampoo on your body for 3-5 minutes before rinsing.  While you have a rash:  Wear loose clothing to stop clothes from rubbing and irritating it.  Wash or change your bed sheets every night.  Wash clothes and bed sheets in hot water.  Disinfect or throw out items that may be infected.  Wash your hands often with soap and water for at least 20 seconds. If soap and water are not available, use hand sanitizer.  If your pet has the same infection, take your pet to see a veterinarian for treatment.  How is this prevented?  Take a bath or shower every day and after every time you work out or play sports.  Dry your skin completely after bathing.  Wear sandals or shoes in public places and showers.  Wash athletic clothes after each use.  Do not share personal items with others.  Avoid touching red patches of skin on other people.  Avoid touching pets that have bald spots.  If you touch an animal that has a bald spot, wash your hands.  Contact a health care provider if:  Your rash continues to spread after 7 days of  treatment.  Your rash is not gone in 4 weeks.  The area around your rash gets red, warm, tender, and swollen.  This information is not intended to replace advice given to you by your health care provider. Make sure you discuss any questions you have with your health care provider.  Document Revised: 11/22/2021 Document Reviewed: 11/22/2021  Elsevier Patient Education  2024 [employer].

## 2024-03-09 ENCOUNTER — Encounter

## 2024-03-11 ENCOUNTER — Ambulatory Visit
Admission: RE | Admit: 2024-03-11 | Discharge: 2024-03-11 | Disposition: A | Discharge status: Home / Self Care | Source: Ambulatory Visit | Attending: Internal Medicine | Admitting: Internal Medicine

## 2024-03-11 DIAGNOSIS — Z1231 Encounter for screening mammogram for malignant neoplasm of breast: Secondary | ICD-10-CM | POA: Diagnosis not present

## 2024-04-05 ENCOUNTER — Ambulatory Visit (INDEPENDENT_AMBULATORY_CARE_PROVIDER_SITE_OTHER): Admitting: Internal Medicine

## 2024-04-05 ENCOUNTER — Encounter: Payer: Self-pay | Admitting: Internal Medicine

## 2024-04-05 VITALS — BP 132/72 | Ht 64.0 in | Wt 181.0 lb

## 2024-04-05 DIAGNOSIS — E6609 Other obesity due to excess calories: Secondary | ICD-10-CM

## 2024-04-05 DIAGNOSIS — Z6831 Body mass index (BMI) 31.0-31.9, adult: Secondary | ICD-10-CM

## 2024-04-05 DIAGNOSIS — Z23 Encounter for immunization: Secondary | ICD-10-CM

## 2024-04-05 DIAGNOSIS — Z0001 Encounter for general adult medical examination with abnormal findings: Secondary | ICD-10-CM | POA: Diagnosis not present

## 2024-04-05 DIAGNOSIS — Z136 Encounter for screening for cardiovascular disorders: Secondary | ICD-10-CM | POA: Diagnosis not present

## 2024-04-05 DIAGNOSIS — R739 Hyperglycemia, unspecified: Secondary | ICD-10-CM | POA: Diagnosis not present

## 2024-04-05 DIAGNOSIS — E66811 Obesity, class 1: Secondary | ICD-10-CM

## 2024-04-05 MED ORDER — OLMESARTAN MEDOXOMIL-HCTZ 20-12.5 MG PO TABS: 1.0000 | ORAL_TABLET | Freq: Every day | ORAL | 1 refills | Status: AC

## 2024-04-05 NOTE — Progress Notes (Signed)
 Subjective:    Patient ID: Madison Conway, female    DOB: 16-Jan-1971, 53 y.o.   MRN: [id number]  HPI  Patient presents to clinic today for annual exam.  Flu: 02/2021 Tetanus: 11/2015 COVID: X 3 Shingrix: Never Pap smear: 02/2022 Mammogram: 02/2024 Colon screening: 04/2021 Vision screening: every 2 years Dentist: biannually  Diet: She does eat some meat. She consumes more veggies than fruits. She tries to avoid fried foods. She drinks mostly water, soda. Exercise: Gym 4 x week   Review of Systems  Past Medical History:  Diagnosis Date   Anemia    Asthma    childhood.   GERD (gastroesophageal reflux disease)    not a serious class, not even once a month   HTN (hypertension)    Thrombocytosis     Current Outpatient Medications  Medication Sig Dispense Refill   famotidine  (PEPCID ) 10 MG tablet Take 10 mg by mouth daily as needed for heartburn or indigestion.     ferrous sulfate  (FER-IN-SOL) 75 (15 Fe) MG/ML SOLN Take 15 mg of iron by mouth 3 (three) times a week.      Multiple Vitamins-Minerals (MULTIVITAMIN ADULT PO) Take 1 tablet by mouth every other day.      norethindrone -ethinyl estradiol  1/35 (ALYACEN  1/35) tablet Take 1 tablet by mouth daily. 84 tablet 1   olmesartan -hydrochlorothiazide  (BENICAR  HCT) 20-12.5 MG tablet Take 1 tablet by mouth daily. 90 tablet 1   No current facility-administered medications for this visit.    No Known Allergies  Family History  Problem Relation Age of Onset   Hypertension Mother    Diabetes Maternal Grandmother    Hypertension Sister    Breast cancer Neg Hx    Colon cancer Neg Hx    Ovarian cancer Neg Hx     Social History   Socioeconomic History   Marital status: Single    Spouse name: Not on file   Number of children: Not on file   Years of education: Not on file   Highest education level: Some college, no degree  Occupational History   Not on file  Tobacco Use   Smoking status: Never   Smokeless tobacco:  Never  Vaping Use   Vaping status: Never Used  Substance and Sexual Activity   Alcohol use: Yes    Alcohol/week: 5.0 - 6.0 standard drinks of alcohol    Types: 5 - 6 Standard drinks or equivalent per week   Drug use: No   Sexual activity: Yes    Birth control/protection: Pill  Other Topics Concern   Not on file  Social History Narrative   Not on file   Social Drivers of Health   Financial Resource Strain: Low Risk  (04/01/2024)   Overall Financial Resource Strain (CARDIA)    Difficulty of Paying Living Expenses: Not hard at all  Food Insecurity: No Food Insecurity (04/01/2024)   Hunger Vital Sign    Worried About Running Out of Food in the Last Year: Never true    Ran Out of Food in the Last Year: Never true  Transportation Needs: No Transportation Needs (04/01/2024)   PRAPARE - Administrator, Civil Service (Medical): No    Lack of Transportation (Non-Medical): No  Physical Activity: Insufficiently Active (04/01/2024)   Exercise Vital Sign    Days of Exercise per Week: 3 days    Minutes of Exercise per Session: 30 min  Stress: No Stress Concern Present (04/01/2024)   [employer] of Occupational Health -  Occupational Stress Questionnaire    Feeling of Stress: Not at all  Social Connections: Socially Integrated (04/01/2024)   Social Connection and Isolation Panel    Frequency of Communication with Friends and Family: Three times a week    Frequency of Social Gatherings with Friends and Family: Twice a week    Attends Religious Services: More than 4 times per year    Active Member of [employer] or Organizations: Yes    Attends Engineer, structural: More than 4 times per year    Marital Status: Living with partner  Intimate Partner Violence: Not At Risk (08/05/2022)   Humiliation, Afraid, Rape, and Kick questionnaire    Fear of Current or Ex-Partner: No    Emotionally Abused: No    Physically Abused: No    Sexually Abused: No     Constitutional: Denies  fever, malaise, fatigue, headache or abrupt weight changes.  HEENT: Denies eye pain, eye redness, ear pain, ringing in the ears, wax buildup, runny nose, nasal congestion, bloody nose, or sore throat. Respiratory: Denies difficulty breathing, shortness of breath, cough or sputum production.   Cardiovascular: Denies chest pain, chest tightness, palpitations or swelling in the hands or feet.  Gastrointestinal: Pt reports intermittent reflux. Denies abdominal pain, bloating, constipation, diarrhea or blood in the stool.  GU: Denies urgency, frequency, pain with urination, burning sensation, blood in urine, odor or discharge. Musculoskeletal: Denies decrease in range of motion, difficulty with gait, muscle pain or joint pain and swelling.  Skin: Denies redness, rashes, lesions or ulcercations.  Neurological: Denies dizziness, difficulty with memory, difficulty with speech or problems with balance and coordination.  Psych: Denies anxiety, depression, SI/HI.  No other specific complaints in a complete review of systems (except as listed in HPI above).     Objective:   Physical Exam BP (!) 148/84 (BP Location: Right Arm, Patient Position: Sitting, Cuff Size: Normal)   Ht 5' 4 (1.626 m)   Wt 181 lb (82.1 kg)   LMP 03/25/2024 (Approximate)   BMI 31.07 kg/m   Wt Readings from Last 3 Encounters:  04/05/24 181 lb (82.1 kg)  03/02/24 180 lb (81.6 kg)  09/23/23 179 lb (81.2 kg)    General: Appears her stated age, obese, in NAD. Skin: Warm, dry and intact.  HEENT: Head: normal shape and size; Eyes: sclera white, no icterus, conjunctiva pink, PERRLA and EOMs intact;  Neck:  Neck supple, trachea midline. No masses, lumps present. Thyromegaly noted without discrete nodule. Cardiovascular: Normal rate and rhythm. S1,S2 noted.  No murmur, rubs or gallops noted. No JVD or BLE edema. No carotid bruits noted. Pulmonary/Chest: Normal effort and positive vesicular breath sounds. No respiratory distress. No  wheezes, rales or ronchi noted.  Abdomen: Soft and nontender. Normal bowel sounds.  Musculoskeletal: Strength 5/5 BUE/BLE.  No difficulty with gait.  Neurological: Alert and oriented. Cranial nerves II-XII grossly intact. Coordination normal.  Psychiatric: Mood and affect normal. Behavior is normal. Judgment and thought content normal.     BMET    Component Value Date/Time   NA 136 09/23/2023 0859   NA 138 07/09/2016 1448   K 4.1 09/23/2023 0859   CL 100 09/23/2023 0859   CO2 27 09/23/2023 0859   GLUCOSE 87 09/23/2023 0859   BUN 8 09/23/2023 0859   BUN 8 03/04/2017 0000   CREATININE 1.01 09/23/2023 0859   CALCIUM 9.2 09/23/2023 0859   GFRNONAA 59 (L) 08/07/2022 0322   GFRNONAA 66 03/17/2020 1007   GFRAA 77 03/17/2020 1007  Lipid Panel     Component Value Date/Time   CHOL 221 (H) 09/23/2023 0859   CHOL 195 02/28/2016 0000   TRIG 78 09/23/2023 0859   TRIG 86 02/28/2016 0000   HDL 142 09/23/2023 0859   CHOLHDL 1.6 09/23/2023 0859   LDLCALC 63 09/23/2023 0859    CBC    Component Value Date/Time   WBC 9.8 09/23/2023 0859   RBC 4.10 09/23/2023 0859   HGB 11.5 (L) 09/23/2023 0859   HGB 11.5 05/31/2021 1628   HCT 35.2 09/23/2023 0859   HCT 35.1 05/31/2021 1628   PLT 378 09/23/2023 0859   PLT 491 (H) 05/31/2021 1628   MCV 85.9 09/23/2023 0859   MCV 82 05/31/2021 1628   MCH 28.0 09/23/2023 0859   MCHC 32.7 09/23/2023 0859   RDW 13.9 09/23/2023 0859   RDW 13.5 05/31/2021 1628   LYMPHSABS 1,853 03/17/2020 1007   LYMPHSABS 2.2 03/29/2019 1536   MONOABS 0.6 04/13/2018 1106   EOSABS 362 03/17/2020 1007   EOSABS 0.2 03/29/2019 1536   BASOSABS 57 03/17/2020 1007   BASOSABS 0.0 03/29/2019 1536    Hgb A1C Lab Results  Component Value Date   HGBA1C 5.3 09/23/2023             Assessment & Plan:   Preventative health maintenance:  Flu shot today Tetanus UTD Encouraged her to get her COVID booster Discussed Shingrix vaccine, she will check coverage with  her insurance company and schedule visit if she would like to have this done Pap smear UTD Mammogram UTD Colon screening UTD Encouraged her to consume a balanced diet and exercise regimen Advised her to see an eye doctor and dentist annually We will check CBC, c-Met, lipid, A1c today  RTC in 6 months, follow-up chronic conditions Angeline Laura, NP

## 2024-04-05 NOTE — Patient Instructions (Signed)

## 2024-04-05 NOTE — Assessment & Plan Note (Signed)
 Encouraged diet and exercise for weight loss ?

## 2024-04-06 ENCOUNTER — Ambulatory Visit: Payer: Self-pay | Admitting: Internal Medicine

## 2024-04-06 LAB — CBC
HCT: 36.6 % (ref 35.0–45.0)
Hemoglobin: 11.8 g/dL (ref 11.7–15.5)
MCH: 28.6 pg (ref 27.0–33.0)
MCHC: 32.2 g/dL (ref 32.0–36.0)
MCV: 88.8 fL (ref 80.0–100.0)
MPV: 8.6 fL (ref 7.5–12.5)
Platelets: 421 Thousand/uL — ABNORMAL HIGH (ref 140–400)
RBC: 4.12 Million/uL (ref 3.80–5.10)
RDW: 13.4 % (ref 11.0–15.0)
WBC: 7.3 Thousand/uL (ref 3.8–10.8)

## 2024-04-06 LAB — COMPREHENSIVE METABOLIC PANEL WITH GFR
AG Ratio: 1.2 (calc) (ref 1.0–2.5)
ALT: 20 U/L (ref 6–29)
AST: 22 U/L (ref 10–35)
Albumin: 4.1 g/dL (ref 3.6–5.1)
Alkaline phosphatase (APISO): 60 U/L (ref 37–153)
BUN/Creatinine Ratio: 9 (calc) (ref 6–22)
BUN: 10 mg/dL (ref 7–25)
CO2: 27 mmol/L (ref 20–32)
Calcium: 9.3 mg/dL (ref 8.6–10.4)
Chloride: 98 mmol/L (ref 98–110)
Creat: 1.08 mg/dL — ABNORMAL HIGH (ref 0.50–1.03)
Globulin: 3.3 g/dL (ref 1.9–3.7)
Glucose, Bld: 94 mg/dL (ref 65–99)
Potassium: 3.6 mmol/L (ref 3.5–5.3)
Sodium: 133 mmol/L — ABNORMAL LOW (ref 135–146)
Total Bilirubin: 1.3 mg/dL — ABNORMAL HIGH (ref 0.2–1.2)
Total Protein: 7.4 g/dL (ref 6.1–8.1)
eGFR: 62 mL/min/1.73m2 (ref >=60)

## 2024-04-06 LAB — HEMOGLOBIN A1C
Hgb A1c MFr Bld: 5.1 % (ref <5.7)
Mean Plasma Glucose: 100 mg/dL
eAG (mmol/L): 5.5 mmol/L

## 2024-04-06 LAB — LIPID PANEL
Cholesterol: 228 mg/dL — ABNORMAL HIGH (ref <200)
HDL: 131 mg/dL (ref >=50)
LDL Cholesterol (Calc): 74 mg/dL
Non-HDL Cholesterol (Calc): 97 mg/dL (ref <130)
Total CHOL/HDL Ratio: 1.7 (calc) (ref <5.0)
Triglycerides: 142 mg/dL (ref <150)

## 2024-05-24 ENCOUNTER — Other Ambulatory Visit: Payer: Self-pay | Admitting: Internal Medicine

## 2024-05-24 DIAGNOSIS — Z3041 Encounter for surveillance of contraceptive pills: Secondary | ICD-10-CM

## 2024-05-27 NOTE — Telephone Encounter (Signed)
 Requested Prescriptions  Pending Prescriptions Disp Refills   norethindrone -ethinyl estradiol  1/35 (ALYACEN  1/35) tablet [Pharmacy Med Name: ALYACEN  1-35 28 TABLET] 84 tablet 3    Sig: TAKE 1 TABLET BY MOUTH EVERY DAY     OB/GYN:  Contraceptives Passed - 05/27/2024  2:26 PM      Passed - Last BP in normal range    BP Readings from Last 1 Encounters:  04/05/24 132/72         Passed - Valid encounter within last 12 months    Recent Outpatient Visits           1 month ago Encounter for general adult medical examination with abnormal findings   [facility] [place], Angeline ORN, NP   2 months ago Tinea corporis   [facility], Angeline ORN, NP   8 months ago Primary hypertension   [place] [facility] [place], Angeline ORN, [place]              Passed - Patient is not a smoker

## 2024-07-09 ENCOUNTER — Encounter: Payer: Self-pay | Admitting: Internal Medicine

## 2024-10-05 ENCOUNTER — Ambulatory Visit: Admitting: Internal Medicine

## 2025-03-08 ENCOUNTER — Encounter: Admitting: Internal Medicine
